# Patient Record
Sex: Female | Born: 1985
Health system: Southern US, Community
[De-identification: ages and names within clinical notes are randomized; demographics above are authoritative.]

## PROBLEM LIST (undated history)

## (undated) DIAGNOSIS — F419 Anxiety disorder, unspecified: Secondary | ICD-10-CM

## (undated) DIAGNOSIS — R519 Headache, unspecified: Secondary | ICD-10-CM

## (undated) DIAGNOSIS — K219 Gastro-esophageal reflux disease without esophagitis: Secondary | ICD-10-CM

## (undated) DIAGNOSIS — N939 Abnormal uterine and vaginal bleeding, unspecified: Secondary | ICD-10-CM

## (undated) DIAGNOSIS — R112 Nausea with vomiting, unspecified: Secondary | ICD-10-CM

## (undated) DIAGNOSIS — Z9889 Other specified postprocedural states: Secondary | ICD-10-CM

## (undated) DIAGNOSIS — N200 Calculus of kidney: Secondary | ICD-10-CM

## (undated) DIAGNOSIS — I10 Essential (primary) hypertension: Secondary | ICD-10-CM

## (undated) DIAGNOSIS — D649 Anemia, unspecified: Secondary | ICD-10-CM

## (undated) DIAGNOSIS — Z87442 Personal history of urinary calculi: Secondary | ICD-10-CM

## (undated) HISTORY — PX: CYSTOSCOPY: SUR368

## (undated) HISTORY — DX: Calculus of kidney: N20.0

## (undated) HISTORY — PX: BREAST CYST ASPIRATION: SHX578

## (undated) HISTORY — DX: Essential (primary) hypertension: I10

## (undated) SURGERY — Surgical Case
Anesthesia: *Unknown

---

## 2003-02-22 ENCOUNTER — Encounter: Admission: RE | Admit: 2003-02-22 | Discharge: 2003-02-22 | Payer: Self-pay | Admitting: Sports Medicine

## 2003-06-13 ENCOUNTER — Encounter: Admission: RE | Admit: 2003-06-13 | Discharge: 2003-06-13 | Payer: Self-pay | Admitting: Family Medicine

## 2003-06-27 ENCOUNTER — Encounter: Admission: RE | Admit: 2003-06-27 | Discharge: 2003-06-27 | Payer: Self-pay | Admitting: Family Medicine

## 2003-09-20 ENCOUNTER — Encounter: Admission: RE | Admit: 2003-09-20 | Discharge: 2003-09-20 | Payer: Self-pay | Admitting: *Deleted

## 2005-09-02 ENCOUNTER — Ambulatory Visit: Payer: Self-pay | Admitting: Family Medicine

## 2006-05-19 ENCOUNTER — Ambulatory Visit: Payer: Self-pay | Admitting: Sports Medicine

## 2006-05-25 ENCOUNTER — Ambulatory Visit: Payer: Self-pay | Admitting: Family Medicine

## 2006-06-09 ENCOUNTER — Encounter (INDEPENDENT_AMBULATORY_CARE_PROVIDER_SITE_OTHER): Payer: Self-pay | Admitting: *Deleted

## 2006-06-09 LAB — CONVERTED CEMR LAB

## 2006-06-15 LAB — CONVERTED CEMR LAB: Pap Smear: NORMAL

## 2006-06-17 ENCOUNTER — Encounter (INDEPENDENT_AMBULATORY_CARE_PROVIDER_SITE_OTHER): Payer: Self-pay | Admitting: Family Medicine

## 2006-06-17 ENCOUNTER — Ambulatory Visit: Payer: Self-pay | Admitting: Family Medicine

## 2006-06-17 LAB — CONVERTED CEMR LAB: TSH: 1.501 microintl units/mL (ref 0.350–5.50)

## 2006-08-07 ENCOUNTER — Encounter (INDEPENDENT_AMBULATORY_CARE_PROVIDER_SITE_OTHER): Payer: Self-pay | Admitting: *Deleted

## 2006-08-17 ENCOUNTER — Ambulatory Visit: Payer: Self-pay | Admitting: Family Medicine

## 2006-08-25 ENCOUNTER — Telehealth: Payer: Self-pay | Admitting: *Deleted

## 2006-08-25 ENCOUNTER — Ambulatory Visit: Payer: Self-pay | Admitting: Family Medicine

## 2006-09-15 ENCOUNTER — Ambulatory Visit: Payer: Self-pay | Admitting: Sports Medicine

## 2006-09-15 DIAGNOSIS — R61 Generalized hyperhidrosis: Secondary | ICD-10-CM | POA: Insufficient documentation

## 2006-09-15 DIAGNOSIS — N209 Urinary calculus, unspecified: Secondary | ICD-10-CM | POA: Insufficient documentation

## 2006-09-15 DIAGNOSIS — I10 Essential (primary) hypertension: Secondary | ICD-10-CM | POA: Insufficient documentation

## 2006-09-15 LAB — CONVERTED CEMR LAB
Bilirubin Urine: NEGATIVE
Blood in Urine, dipstick: NEGATIVE
Chlamydia, DNA Probe: NEGATIVE
GC Probe Amp, Genital: NEGATIVE
Glucose, Urine, Semiquant: NEGATIVE
Ketones, urine, test strip: NEGATIVE
Nitrite: NEGATIVE
Specific Gravity, Urine: >=1.03
Urobilinogen, UA: 0.2
Whiff Test: NEGATIVE
pH: 5.5

## 2006-09-16 ENCOUNTER — Encounter (INDEPENDENT_AMBULATORY_CARE_PROVIDER_SITE_OTHER): Payer: Self-pay | Admitting: Sports Medicine

## 2006-09-16 ENCOUNTER — Telehealth: Payer: Self-pay | Admitting: *Deleted

## 2006-10-19 ENCOUNTER — Telehealth (INDEPENDENT_AMBULATORY_CARE_PROVIDER_SITE_OTHER): Payer: Self-pay | Admitting: *Deleted

## 2006-10-23 ENCOUNTER — Ambulatory Visit: Payer: Self-pay | Admitting: Family Medicine

## 2006-11-13 ENCOUNTER — Ambulatory Visit: Payer: Self-pay | Admitting: Sports Medicine

## 2006-11-30 ENCOUNTER — Telehealth: Payer: Self-pay | Admitting: *Deleted

## 2006-11-30 ENCOUNTER — Encounter: Admission: RE | Admit: 2006-11-30 | Discharge: 2006-11-30 | Payer: Self-pay | Admitting: Sports Medicine

## 2006-11-30 ENCOUNTER — Ambulatory Visit: Payer: Self-pay | Admitting: Sports Medicine

## 2006-12-02 ENCOUNTER — Telehealth (INDEPENDENT_AMBULATORY_CARE_PROVIDER_SITE_OTHER): Payer: Self-pay | Admitting: *Deleted

## 2006-12-08 ENCOUNTER — Telehealth: Payer: Self-pay | Admitting: *Deleted

## 2006-12-08 ENCOUNTER — Encounter: Payer: Self-pay | Admitting: Family Medicine

## 2006-12-15 ENCOUNTER — Telehealth (INDEPENDENT_AMBULATORY_CARE_PROVIDER_SITE_OTHER): Payer: Self-pay | Admitting: *Deleted

## 2006-12-17 ENCOUNTER — Encounter: Admission: RE | Admit: 2006-12-17 | Discharge: 2006-12-17 | Payer: Self-pay | Admitting: Sports Medicine

## 2006-12-21 ENCOUNTER — Encounter: Payer: Self-pay | Admitting: Family Medicine

## 2006-12-21 ENCOUNTER — Telehealth (INDEPENDENT_AMBULATORY_CARE_PROVIDER_SITE_OTHER): Payer: Self-pay | Admitting: Family Medicine

## 2007-02-02 ENCOUNTER — Telehealth: Payer: Self-pay | Admitting: *Deleted

## 2007-02-04 ENCOUNTER — Ambulatory Visit: Payer: Self-pay | Admitting: Family Medicine

## 2007-02-04 DIAGNOSIS — E739 Lactose intolerance, unspecified: Secondary | ICD-10-CM | POA: Insufficient documentation

## 2007-04-26 ENCOUNTER — Ambulatory Visit: Payer: Self-pay | Admitting: Family Medicine

## 2007-05-03 ENCOUNTER — Encounter: Payer: Self-pay | Admitting: Family Medicine

## 2007-05-03 ENCOUNTER — Ambulatory Visit: Payer: Self-pay | Admitting: Family Medicine

## 2007-05-03 LAB — CONVERTED CEMR LAB
Bilirubin Urine: NEGATIVE
Blood in Urine, dipstick: NEGATIVE
Glucose, Urine, Semiquant: NEGATIVE
Hemoglobin: 12.8 g/dL
Ketones, urine, test strip: NEGATIVE
Nitrite: NEGATIVE
Protein, U semiquant: NEGATIVE
Specific Gravity, Urine: 1.025
Urobilinogen, UA: 0.2
pH: 6

## 2007-05-05 ENCOUNTER — Telehealth (INDEPENDENT_AMBULATORY_CARE_PROVIDER_SITE_OTHER): Payer: Self-pay | Admitting: *Deleted

## 2007-05-05 ENCOUNTER — Telehealth: Payer: Self-pay | Admitting: Family Medicine

## 2007-05-20 ENCOUNTER — Telehealth: Payer: Self-pay | Admitting: Family Medicine

## 2007-07-20 ENCOUNTER — Ambulatory Visit: Payer: Self-pay | Admitting: Family Medicine

## 2007-09-14 ENCOUNTER — Telehealth: Payer: Self-pay | Admitting: Family Medicine

## 2007-10-13 ENCOUNTER — Ambulatory Visit: Payer: Self-pay | Admitting: Family Medicine

## 2007-11-29 ENCOUNTER — Telehealth: Payer: Self-pay | Admitting: *Deleted

## 2007-12-06 ENCOUNTER — Telehealth: Payer: Self-pay | Admitting: Family Medicine

## 2007-12-22 ENCOUNTER — Ambulatory Visit: Payer: Self-pay | Admitting: Family Medicine

## 2007-12-22 DIAGNOSIS — L659 Nonscarring hair loss, unspecified: Secondary | ICD-10-CM | POA: Insufficient documentation

## 2008-01-05 ENCOUNTER — Telehealth: Payer: Self-pay | Admitting: *Deleted

## 2008-01-12 ENCOUNTER — Telehealth: Payer: Self-pay | Admitting: *Deleted

## 2008-04-24 ENCOUNTER — Telehealth: Payer: Self-pay | Admitting: *Deleted

## 2008-06-05 ENCOUNTER — Telehealth: Payer: Self-pay | Admitting: Family Medicine

## 2008-06-07 ENCOUNTER — Telehealth: Payer: Self-pay | Admitting: *Deleted

## 2008-06-23 ENCOUNTER — Telehealth (INDEPENDENT_AMBULATORY_CARE_PROVIDER_SITE_OTHER): Payer: Self-pay | Admitting: *Deleted

## 2008-06-27 ENCOUNTER — Telehealth: Payer: Self-pay | Admitting: Family Medicine

## 2008-08-17 ENCOUNTER — Ambulatory Visit: Payer: Self-pay | Admitting: Family Medicine

## 2008-08-17 ENCOUNTER — Encounter: Payer: Self-pay | Admitting: Family Medicine

## 2008-08-17 DIAGNOSIS — K649 Unspecified hemorrhoids: Secondary | ICD-10-CM | POA: Insufficient documentation

## 2008-08-17 DIAGNOSIS — N760 Acute vaginitis: Secondary | ICD-10-CM | POA: Insufficient documentation

## 2008-08-17 LAB — CONVERTED CEMR LAB
Chlamydia, Swab/Urine, PCR: NEGATIVE
GC Probe Amp, Urine: NEGATIVE
Whiff Test: NEGATIVE

## 2008-08-20 ENCOUNTER — Encounter: Payer: Self-pay | Admitting: Family Medicine

## 2009-03-16 ENCOUNTER — Ambulatory Visit: Payer: Self-pay | Admitting: Internal Medicine

## 2009-03-16 DIAGNOSIS — K921 Melena: Secondary | ICD-10-CM | POA: Insufficient documentation

## 2009-03-19 ENCOUNTER — Ambulatory Visit: Payer: Self-pay | Admitting: Gastroenterology

## 2009-03-19 DIAGNOSIS — K589 Irritable bowel syndrome without diarrhea: Secondary | ICD-10-CM | POA: Insufficient documentation

## 2009-03-19 LAB — CONVERTED CEMR LAB
ALT: 20 units/L (ref 0–35)
ALT: 17 units/L (ref 0–35)
AST: 22 units/L (ref 0–37)
AST: 22 units/L (ref 0–37)
Albumin: 4.3 g/dL (ref 3.5–5.2)
Albumin: 4.3 g/dL (ref 3.5–5.2)
Alkaline Phosphatase: 54 units/L (ref 39–117)
Alkaline Phosphatase: 57 units/L (ref 39–117)
BUN: 9 mg/dL (ref 6–23)
BUN: 9 mg/dL (ref 6–23)
Basophils Absolute: 0 10*3/uL (ref 0.0–0.1)
Basophils Absolute: 0.1 10*3/uL (ref 0.0–0.1)
Basophils Relative: 0.3 % (ref 0.0–3.0)
Basophils Relative: 1.2 % (ref 0.0–3.0)
Bilirubin, Direct: 0.1 mg/dL (ref 0.0–0.3)
Bilirubin, Direct: 0.1 mg/dL (ref 0.0–0.3)
CO2: 20 meq/L (ref 19–32)
CO2: 26 meq/L (ref 19–32)
Calcium: 10 mg/dL (ref 8.4–10.5)
Calcium: 9.4 mg/dL (ref 8.4–10.5)
Chloride: 105 meq/L (ref 96–112)
Chloride: 105 meq/L (ref 96–112)
Creatinine, Ser: 0.8 mg/dL (ref 0.4–1.2)
Creatinine, Ser: 0.7 mg/dL (ref 0.4–1.2)
Eosinophils Absolute: 0.1 10*3/uL (ref 0.0–0.7)
Eosinophils Absolute: 0.1 10*3/uL (ref 0.0–0.7)
Eosinophils Relative: 2 % (ref 0.0–5.0)
Eosinophils Relative: 1.3 % (ref 0.0–5.0)
Ferritin: 79 ng/mL (ref 10.0–291.0)
Folate: >20 ng/mL
GFR calc non Af Amer: 111.84 mL/min (ref >60)
GFR calc non Af Amer: 130.46 mL/min (ref >60)
Glucose, Bld: 64 mg/dL — ABNORMAL LOW (ref 70–99)
Glucose, Bld: 76 mg/dL (ref 70–99)
HCT: 39.5 % (ref 36.0–46.0)
HCT: 36.3 % (ref 36.0–46.0)
Hemoglobin: 13.7 g/dL (ref 12.0–15.0)
Hemoglobin: 13 g/dL (ref 12.0–15.0)
IgA: 218 mg/dL (ref 68–378)
Iron: 44 ug/dL (ref 42–145)
Lymphocytes Relative: 35.2 % (ref 12.0–46.0)
Lymphocytes Relative: 37.3 % (ref 12.0–46.0)
Lymphs Abs: 2.5 10*3/uL (ref 0.7–4.0)
Lymphs Abs: 2.8 10*3/uL (ref 0.7–4.0)
MCHC: 34.6 g/dL (ref 30.0–36.0)
MCHC: 35.7 g/dL (ref 30.0–36.0)
MCV: 95.3 fL (ref 78.0–100.0)
MCV: 97.2 fL (ref 78.0–100.0)
Monocytes Absolute: 0.6 10*3/uL (ref 0.1–1.0)
Monocytes Absolute: 0.5 10*3/uL (ref 0.1–1.0)
Monocytes Relative: 7.9 % (ref 3.0–12.0)
Monocytes Relative: 6.4 % (ref 3.0–12.0)
Neutro Abs: 3.8 10*3/uL (ref 1.4–7.7)
Neutro Abs: 4 10*3/uL (ref 1.4–7.7)
Neutrophils Relative %: 54.6 % (ref 43.0–77.0)
Neutrophils Relative %: 53.8 % (ref 43.0–77.0)
Platelets: 306 10*3/uL (ref 150.0–400.0)
Platelets: 277 10*3/uL (ref 150.0–400.0)
Potassium: 4.6 meq/L (ref 3.5–5.1)
Potassium: 4.6 meq/L (ref 3.5–5.1)
RBC: 4.14 M/uL (ref 3.87–5.11)
RBC: 3.74 M/uL — ABNORMAL LOW (ref 3.87–5.11)
RDW: 12.3 % (ref 11.5–14.6)
RDW: 12.1 % (ref 11.5–14.6)
Saturation Ratios: 14.4 % — ABNORMAL LOW (ref 20.0–50.0)
Sed Rate: 17 mm/hr (ref 0–22)
Sodium: 136 meq/L (ref 135–145)
Sodium: 140 meq/L (ref 135–145)
TSH: 1.58 microintl units/mL (ref 0.35–5.50)
TSH: 1.28 microintl units/mL (ref 0.35–5.50)
Tissue Transglutaminase Ab, IgA: 0.4 units (ref <7)
Total Bilirubin: 1 mg/dL (ref 0.3–1.2)
Total Bilirubin: 0.6 mg/dL (ref 0.3–1.2)
Total Protein: 7 g/dL (ref 6.0–8.3)
Total Protein: 7.6 g/dL (ref 6.0–8.3)
Transferrin: 217.7 mg/dL (ref 212.0–360.0)
Vitamin B-12: 609 pg/mL (ref 211–911)
WBC: 7 10*3/uL (ref 4.5–10.5)
WBC: 7.5 10*3/uL (ref 4.5–10.5)

## 2009-03-26 ENCOUNTER — Telehealth: Payer: Self-pay | Admitting: Gastroenterology

## 2009-03-30 ENCOUNTER — Ambulatory Visit: Payer: Self-pay | Admitting: Gastroenterology

## 2009-05-01 ENCOUNTER — Encounter: Admission: RE | Admit: 2009-05-01 | Discharge: 2009-06-06 | Payer: Self-pay | Admitting: Gastroenterology

## 2009-05-01 ENCOUNTER — Encounter: Payer: Self-pay | Admitting: Gastroenterology

## 2009-05-11 ENCOUNTER — Ambulatory Visit: Payer: Self-pay | Admitting: Gastroenterology

## 2009-05-11 DIAGNOSIS — R059 Cough, unspecified: Secondary | ICD-10-CM | POA: Insufficient documentation

## 2009-05-11 DIAGNOSIS — R05 Cough: Secondary | ICD-10-CM

## 2009-07-02 ENCOUNTER — Telehealth: Payer: Self-pay | Admitting: Gastroenterology

## 2009-07-13 ENCOUNTER — Encounter: Admission: RE | Admit: 2009-07-13 | Discharge: 2009-07-13 | Payer: Self-pay | Admitting: Emergency Medicine

## 2009-07-17 ENCOUNTER — Telehealth (INDEPENDENT_AMBULATORY_CARE_PROVIDER_SITE_OTHER): Payer: Self-pay | Admitting: *Deleted

## 2009-07-21 ENCOUNTER — Emergency Department (HOSPITAL_COMMUNITY): Admission: EM | Admit: 2009-07-21 | Discharge: 2009-07-21 | Payer: Self-pay | Admitting: Emergency Medicine

## 2009-07-27 ENCOUNTER — Telehealth (INDEPENDENT_AMBULATORY_CARE_PROVIDER_SITE_OTHER): Payer: Self-pay | Admitting: *Deleted

## 2009-09-27 ENCOUNTER — Telehealth: Payer: Self-pay | Admitting: Family Medicine

## 2009-10-08 ENCOUNTER — Telehealth: Payer: Self-pay | Admitting: Gastroenterology

## 2010-07-09 NOTE — Progress Notes (Signed)
Summary: refll  Phone Note Call from Patient Call back at Home Phone [phone]   Caller: Patient Summary of Call: needs this refilled today (see rx note) Initial call taken by: De Nurse,  September 27, 2009 10:00 AM    done

## 2010-07-09 NOTE — Assessment & Plan Note (Signed)
Summary: F/U APPT...LSW.   History of Present Illness Visit Type: Follow-up Visit Primary GI MD: Sheryn Bison MD FACP FAGA Primary Bita Cartwright: Linus Mako, MD Requesting Amonda Brillhart: n/a Chief Complaint: Loose stools and bleeding has stopped, bowel habits normal History of Present Illness:   This patient is a 25 year old female who has lactose intolerance her previous clinic visit. Review of all lab data is unremarkable. She currently is asymptomatic in terms of diarrhea, abdominal pain or rectal bleeding. She does have recurrent coughing and a globus sensation in throat but denies true reflux symptoms. She takes acyclovir 4 mg twice a day and metaprolol 50 mg twice a day for hypertension. She has a family history of early coronary artery disease and apparently suffers from hyperlipidemia.   GI Review of Systems      Denies abdominal pain, acid reflux, belching, bloating, chest pain, dysphagia with liquids, dysphagia with solids, heartburn, loss of appetite, nausea, vomiting, vomiting blood, weight loss, and  weight gain.        Denies anal fissure, black tarry stools, change in bowel habit, constipation, diarrhea, diverticulosis, fecal incontinence, heme positive stool, hemorrhoids, irritable bowel syndrome, jaundice, light color stool, liver problems, rectal bleeding, and  rectal pain.    Current Medications (verified): 1)  Acyclovir 400 Mg Tabs (Acyclovir) .... Two Times A Day 2)  Metoprolol Tartrate 50 Mg Tabs (Metoprolol Tartrate) .Marland Kitchen.. 1 By Mouth Two Times A Day  Allergies (verified): 1)  ! * Elidil 2)  Flagyl (Metronidazole)  Past History:  Past medical, surgical, family and social histories (including risk factors) reviewed for relevance to current acute and chronic problems.  Past Medical History: Reviewed history from 03/16/2009 and no changes required. depo for bc, frequent vaginitis, kidney stone with litho and stent, urinary frequency HTN  Past Surgical  History: Reviewed history from 03/16/2009 and no changes required. s/p stone retreival procedure x 2  - 1999 - in [place]  Family History: Reviewed history from 03/19/2009 and no changes required. cad--mom 30s mi and CABG in her 40's, dm--uncle, htn--mom and dad No FH of Colon Cancer:  Social History: Reviewed history from 03/16/2009 and no changes required. No smoking Denies sexual activity Occupation: - student - [employer] Alcohol use-yes Single no children  Review of Systems       The patient complains of cough, shortness of breath, and thirst - excessive.  The patient denies allergy/sinus, anemia, anxiety-new, arthritis/joint pain, back pain, blood in urine, breast changes/lumps, change in vision, confusion, coughing up blood, depression-new, fainting, fatigue, fever, headaches-new, hearing problems, heart murmur, heart rhythm changes, itching, menstrual pain, muscle pains/cramps, night sweats, nosebleeds, pregnancy symptoms, skin rash, sleeping problems, sore throat, swelling of feet/legs, swollen lymph glands, thirst - excessive , urination - excessive , urination changes/pain, urine leakage, vision changes, and voice change.    Vital Signs:  Patient profile:   25 year old female Height:      63 inches Weight:      117.25 pounds Pulse rate:   64 / minute Pulse rhythm:   regular BP sitting:   96 / 62  (right arm) Cuff size:   regular  Physical Exam  General:  Well developed, well nourished, no acute distress.healthy appearing.   Head:  Normocephalic and atraumatic. Eyes:  PERRLA, no icterus.exam deferred to patient's ophthalmologist.   Lungs:  Clear throughout to auscultation. Heart:  Regular rate and rhythm; no murmurs, rubs,  or bruits. Abdomen:  Soft, nontender and nondistended. No masses, hepatosplenomegaly or hernias noted. Normal  bowel sounds. Cervical Nodes:  No significant cervical adenopathy. Psych:  Alert and cooperative. Normal mood and affect.   Impression &  Recommendations:  Problem # 1:  IRRITABLE BOWEL SYNDROME (ICD-564.1) Assessment Improved She has IBS and lactose intolerance is currently doing well with avoidance of major lactose products and other nonabsorbable carbohydrates.  Problem # 2:  COUGH (ICD-786.2) Assessment: New This may be from gastroesophageal reflux disease and I will give her a trial of AcipHex 20 mg 30 minutes before breakfast with followup in one month's time.  Problem # 3:  HYPERTENSION, BENIGN (ICD-401.1) Assessment: Improved blood pressure today is 96/62. Apparently her primary care physician is adjusting her metaprolol dosage.  Patient Instructions: 1)  Copy sent to : Dr. Linus Mako 2)  Please continue current medications.  3)  Avoid foods high in acid content ( tomatoes, citrus juices, spicy foods) . Avoid eating within 3 to 4 hours of lying down or before exercising. Do not over eat; try smaller more frequent meals. Elevate head of bed four inches when sleeping.  4)  AcipHex trial 20 mg q.a.m. 5)  Please schedule a follow-up appointment in 1 month.  6)  Lactose Free Diet handout given.   Appended Document: F/U APPT...LSW.    Clinical Lists Changes  Medications: Added new medication of ACIPHEX 20 MG  TBEC (RABEPRAZOLE SODIUM) Take 1 each day 30 minutes before meals

## 2010-07-09 NOTE — Letter (Signed)
Summary: Care Plan/[facility]  Care Plan/NUTRI[employer]DBS[facility]MT   Imported By: Lester Towanda 06/08/2009 09:41:08  _____________________________________________________________________  External Attachment:    Type:   Image     Comment:   External Document

## 2010-07-09 NOTE — Assessment & Plan Note (Signed)
Summary: FU FROM [place] WP   Vital Signs:  Patient Profile:   25 Years Old Female Weight:      124.0 pounds Temp:     97.1 degrees F Pulse rate:   73 / minute BP sitting:   116 / 78  (left arm)  Pt. in pain?   no  Vitals Entered By: Starleen Blue RN (May 03, 2007 1:41 PM)                  Chief Complaint:  sweating, hemorrhoid, and bladder pressure.  Acute Visit History:      Other comments include: pt is a 25 y/o female here with multiple complaints. she complains of excessive sweating, particularly in the inguinal region, which she has been evaluated for in the past. she also complains of a hemorrhoid, which has had increased bleeding over the last two months. the hemorrhoid has been there several years and she has always noted a little bleeding esp when constipated. the hemorrhoid is only painful when constipated and pt straining to pass stool. pt also concerned about episode of unprotected sex two years ago and constellation of benign dxs since (PNA, tinea versicolor, viral URI) and would like an HIV test. also diagnosed with UTI last year, but reports continued bladder pressure with urination. states she completed full course of abx at that time. denies dysuria, foul odor,  change in urine color, etc. .        Current Allergies: FLAGYL (METRONIDAZOLE)       Physical Exam  General:     Well-developed,well-nourished,in no acute distress; alert,appropriate and cooperative throughout examination Abdomen:     Bowel sounds positive,abdomen soft and non-tender without masses, organomegaly or hernias noted. no CVA tenderness bilaterally Rectal:     external hemorrhoid. no bleeding noted. FOBT negative. no masses, no tenderness, no fissures, no fistulae, no perianal rash Cervical Nodes:     No lymphadenopathy noted Psych:     Oriented X3, normally interactive, good eye contact, and slightly anxious.      Impression & Recommendations:  Problem # 1:  EXTERNAL HEMORRHOIDS  WITH OTHER COMPLICATION (ICD-455.5) Assessment: Unchanged No obvious site of bleeding. will treat with stool softener for six weeks and anusol for pain. FOBT negative. pt may need eval in future for internal hemorrhoids. Encouraged to eat high fiber diet.   Orders: Hemoglobin-[facility] (16109) [facility]- Est Level  3 (60454)   Problem # 2:  HYPERHIDROSIS (ICD-780.8) Assessment: Unchanged This problem is not new for patient. She is encouraged to use antiperspirant for symptom control. If not helping, rx given for Drysol. May need continued mgmt by PCP.   Problem # 3:  SEXUALLY TRANSMITTED DISEASE, EXPOSURE TO (ICD-V01.6) Assessment: Comment Only Pt counseled on safe sexual practices and using condoms. Only concerned about HIV. Will check today.   Orders: HIV-[facility] (09811-91478) [facility]- Est Level  3 (29562)   Problem # 4:  SYMPTOM, FREQUENCY, URINARY (ICD-788.41) Assessment: Comment Only U/A with lots of epithelial cells but some bacteria and some leukocyte esterace. will hold on abx treatment and await results of urine culture.  Orders: Urinalysis-[facility] (00000) Urine Culture-[facility] ([id number]) [facility]- Est Level  3 (96295)   Complete Medication List: 1)  Acyclovir 400 Mg Tabs (Acyclovir) .... Take 1 tablet twice a day 2)  Metoprolol Tartrate 50 Mg Tabs (Metoprolol tartrate) 3)  Colace 100 Mg Caps (Docusate sodium) .... One tab by mouth two times a day x6 weeks 4)  Anusol-hc 2.5 % Crea (Hydrocortisone) .Marland KitchenMarland KitchenMarland Kitchen  Apply to anal area twice a day 5)  Drysol 20 % Soln (Aluminum chloride) .... Apply to affected area at bedtime   Patient Instructions: 1)  Follow up in 4-6 weeks with Dr. Jennette Kettle.  2)  Apply antiperspirant to problem areas as needed.     Prescriptions: DRYSOL 20 %  SOLN (ALUMINUM CHLORIDE) apply to affected area at bedtime  #1 x 0   Entered and Authorized by:   Lequita Asal  MD   Signed by:   Lequita Asal  MD on 05/03/2007   Method used:   Electronically sent to ...       [facility] [address]       [address]       [place], [place]  [address]       Ph: [phone]       Fax: [phone]   RxID:   [id number] ANUSOL-HC 2.5 %  CREA (HYDROCORTISONE) apply to anal area twice a day  #1 x 0   Entered and Authorized by:   Lequita Asal  MD   Signed by:   Lequita Asal  MD on 05/03/2007   Method used:   Electronically sent to ...       [facility] [address]       [address]       [place], [place]  [address]       Ph: [phone]       Fax: [phone]   RxID:   [id number] COLACE 100 MG CAPS (DOCUSATE SODIUM) one tab by mouth two times a day x6 weeks  #90 x 0   Entered and Authorized by:   Lequita Asal  MD   Signed by:   Lequita Asal  MD on 05/03/2007   Method used:   Electronically sent to ...       [facility] [address]       [address]       [place], [place]  [address]       Ph: [phone]       Fax: [phone]   RxID:   [id number]  ] Laboratory Results   Urine Tests  Date/Time Received: May 03, 2007 2.26  PM  Date/Time Reported: May 03, 2007 3:04 PM   Routine Urinalysis   Color: yellow Appearance: Clear Glucose: negative   (Normal Range: Negative) Bilirubin: negative   (Normal Range: Negative) Ketone: negative   (Normal Range: Negative) Spec. Gravity: 1.025   (Normal Range: 1.003-1.035) Blood: negative   (Normal Range: Negative) pH: 6.0   (Normal Range: 5.0-8.0) Protein: negative   (Normal Range: Negative) Urobilinogen: 0.2   (Normal Range: 0-1) Nitrite: negative   (Normal Range: Negative) Leukocyte Esterace: small   (Normal Range: Negative)  Urine Microscopic WBC/hpf: 5-10 Bacteria: 2+ Mucous: 2+ Epithelial: 10-20    Comments: ...............test performed by......Marland KitchenBonnie A. [place], [place] (ASCP)   Blood Tests   Date/Time Received: May 03, 2007 2:29 PM  Date/Time Reported:  May 03, 2007 2:51 PM    CBC HGB:  12.8 g/dL   (Normal Range: 41.7-40.8 in Males, 12.0-15.0 in Females) Comments: ...................................................................DONNA [facility]  May 03, 2007 2:52 PM

## 2010-07-09 NOTE — Progress Notes (Signed)
Summary: [place] request  Phone Note Call from Patient Call back at Work Phone [phone]   Summary of Call: Pt is requesting to speak with an rn about an allergic reaction to hypercare. Initial call taken by: Haydee Salter,  May 05, 2007 10:33 AM  Follow-up for Phone Call        Pt states she used hypercare for her sweating - advised this is the same thing as drysol as they are both aluminum chloride.  Pt states this caused her to break out in a rash in the areas that she used it in.  Advised pt not to use again, if causing skin irritation.  Advised she can schedule appt with Dr. Jennette Kettle to discuss - pt states she will try using a OTC deodorant in the affected areas, and call back if this does not help.  Follow-up by: AMY MARTIN RN,  May 05, 2007 10:45 AM

## 2010-07-09 NOTE — Assessment & Plan Note (Signed)
Summary: fever cough for 1 week/ls   Vital Signs:  Patient Profile:   25 Years Old Female Weight:      125.8 pounds (57.18 kg) O2 Sat:      99 % Temp:     98.6 degrees F (37.00 degrees C) Pulse rate:   89 / minute BP sitting:   129 / 85  (left arm)  Vitals Entered By: Tomasa Rand (November 30, 2006 9:40 AM)               PCP:  Albertha Ghee MD  Chief Complaint:  Pt c/o fever and cough for one week.  History of Present Illness: Pt reports frequent fever and cough x 1 week. Temperature last pm 102.8 relieved by Tylenol. Cough nonproductive. Pt denies any chest pain, sob, n/v, anorexia. No one else in home sick.       Review of Systems  General      Complains of chills and fever.      Denies loss of appetite.  ENT      Complains of hoarseness.      Denies earache, nasal congestion, postnasal drainage, sinus pressure, and sore throat.  Resp      Complains of cough.      Denies chest discomfort, shortness of breath, sputum productive, and wheezing.  GI      Denies abdominal pain, nausea, and vomiting.   Physical Exam  General:     Well-developed,well-nourished,in no acute distress; alert,appropriate and cooperative throughout examination Ears:     TM's clear bilaterally, no retractions, bulging or exudate Nose:     No nasal drainage; nasal turbinates nonedematous Mouth:     Good dentition. Tongue piercing noted. Mucous membranes moist. No lesions noted. No swelling or exudate noted to posterior pharynx, slight erythema. Neck:     supple, full ROM, and no cervical lymphadenopathy.   Lungs:     No increased wob. Crackles bilaterally in RLL and LLL. Heart:     normal rate, regular rhythm, no murmur, no gallop, and no rub.   Neurologic:     alert & oriented X3.   Skin:     turgor normal and color normal.   Cervical Nodes:     no anterior cervical adenopathy and no posterior cervical adenopathy.   Psych:     Oriented X3 and good eye contact.       Impression & Recommendations:  Problem # 1:  FUO (ICD-780.6) Assessment: New Due to duration of symptoms and high fever, 2view chest ordered to r/o pneumonia. Differential includes acute bronchitis. Pt treated with Zithromax 500mg  x 1 then 250mg  x 4 days. Will follow-up with chest xray Add: CXR with left wedge shaped density in the medial lung base.  Patient deneis SOB, sitting for long periods, chest pain.  She is on Depo Provera and has been on it for one year.  Doubt this is a pulmonary emboli.  I call the patient in follow up 24 hours after being seen, and she did not have fever last night and was feeling better.  She is to get a repeat CXR in 2 weeks, order up front at the desk.  Problem # 2:  COUGH (ICD-786.2) Assessment: New  Orders: Pulse Oximetry- [facility] (94760) Tussionex susp. 1 tsp by mouth q12h as needed cough  Other Orders: Radiology other (Radiology Other)   Patient Instructions: 1)  Take 650-1000mg  of Tylenol every 4-6 hours as needed for relief of pain or comfort of fever AVOID taking  more than 4000mg   in a 24 hour period (can cause liver damage in higher doses). 2)  Go to [facility] to get a chest xray. We will call with the results. Go ahead and start the antibiotic as instructed.

## 2010-07-09 NOTE — Assessment & Plan Note (Signed)
Summary: respirtory s/s & thinks she is lactos intolerant  Medications Added ACYCLOVIR 400 MG TABS (ACYCLOVIR) Take 1 tablet twice a day METOPROLOL TARTRATE 50 MG TABS (METOPROLOL TARTRATE)       Allergies Added: FLAGYL (METRONIDAZOLE)  Vital Signs:  Patient Profile:   25 Years Old Female Weight:      128 pounds Pulse rate:   57 / minute BP sitting:   134 / 87  Vitals Entered By: Lillia Pauls CMA (February 04, 2007 11:18 AM)                 PCP:  Albertha Ghee MD  Chief Complaint:  respiratory sxs and poss lactose intolerance.  History of Present Illness: 25 yo AAF  with hx of PNA on 06/08  c/o  that  6  days ago she  was 99.7 temp, with  body aches , ST, and  dry cough . Pt also reports  some runny nose. Elizabeth Ponce has been using Tylenol as needed and helped.  No SOB, n, v, d, abd. pain, ear. . NO sick contacts. Elizabeth Ponce was concerned because she had PNA on 06/08 and wanted to be sure that did not have it know. A C-xray 2 wks after Zithromax tx show pna resolution.  2- Lactose intolerance?: pt wants to know if she is lactose intolerant. She reports diarrhea when drinking milk , eating cheese.  3- Contraception:  per pt , she is due for depo- provera IM  Current Allergies: FLAGYL (METRONIDAZOLE)      Physical Exam  General:     Well-developed,well-nourished,in no acute distress; alert,appropriate and cooperative throughout examination Eyes:     vision grossly intact and no injection.   Ears:     External ear exam shows no significant lesions or deformities.  Otoscopic examination reveals clear canals, tympanic membranes are intact bilaterally without bulging, retraction, inflammation or discharge. Hearing is grossly normal bilaterally. Nose:     mucosal erythema.   Mouth:     good dentition, no gingival abnormalities, and pharyngeal erythema no exudates.  Lungs:     Normal respiratory effort, chest expands symmetrically. Lungs are clear to auscultation, no  crackles or wheezes. Heart:     Normal rate and regular rhythm. S1 and S2 normal without gallop, murmur, click, rub or other extra sounds. Abdomen:     Bowel sounds positive,abdomen soft and non-tender without masses, organomegaly or hernias noted. Pulses:     2 +  peripheral pulses Extremities:     no edema -    Impression & Recommendations:  Problem # 1:  UPPER RESPIRATORY INFECTION, VIRAL (ICD-465.9) Clear lungs on exam . Clinically stable. Symptoms most likely 2 to URI ( viral). Supportive care : increase fluid intake, tylenol as needed. If SOB, productive cough, condition worsen return to [facility]/ ER Orders: [facility]- Est Level  3 (04540)   Problem # 2:  LACTOSE INTOLERANCE (ICD-271.3) Switch to lactaid ( lactose free milk) or soy milk . F/u with pcp Orders: [facility]- Est Level  3 (98119)   Complete Medication List: 1)  Acyclovir 400 Mg Tabs (Acyclovir) .... Take 1 tablet twice a day 2)  Metoprolol Tartrate 50 Mg Tabs (Metoprolol tartrate)  Other Orders: Depo-Provera 150mg  (J4782)   Patient Instructions: 1)  NEXT DEPO SHOT IS DUE: NOVEMBER 13-27, 2008. PLEASE CALL IN NOVEMBER TO SCHEDULE THIS APPT.      Medication Administration  Injection # 1:    Medication: Depo-Provera 150mg     Diagnosis: CONTRACEPTIVE MANAGEMENT (ICD-V25.09)  Route: IM    Site: L deltoid    Exp Date: 04/09/2009    Lot #: [id number]    Mfr: [email or web address]    Patient tolerated injection without complications    Given by: Lillia Pauls CMA (February 04, 2007 12:13 PM)  Orders Added: 1)  Depo-Provera 150mg  [J1055] 2)  [facility]- Est Level  3 [99213]

## 2010-07-09 NOTE — Progress Notes (Signed)
Summary: Imaging Results   Phone Note Outgoing Call   Call placed by: Army Fossa CMA,  July 17, 2009 10:25 AM Summary of Call: Imaging Results, LMTCB:  US Renal, US Pelvis: Normal  Follow-up for Phone Call        Pt is aware. Army Fossa CMA  July 18, 2009 2:37 PM

## 2010-07-09 NOTE — Assessment & Plan Note (Signed)
Summary: depo bassett wk  Nurse Visit       Medication Administration  Injection # 1:    Medication: Depo-Provera 150mg     Diagnosis: CONTRACEPTIVE MANAGEMENT (ICD-V25.09)    Route: IM    Site: R deltoid    Exp Date: 05/09/2009    Lot #: [id number]    Mfr: [email or web address]    Comments: next depo due aug 22-sept 5, 2008    Patient tolerated injection without complications    Given by: Dedra Skeens CMA, (November 13, 2006 12:19 PM)  Orders Added: 1)  Depo-Provera 150mg  [J1055]

## 2010-07-09 NOTE — Progress Notes (Signed)
Summary: wi request  Phone Note Call from Patient   Reason for Call: Talk to Nurse Summary of Call: pt is requesting wi appt, she thinks she may be having allergic reaction to laundry detergent Initial call taken by: ERIN LEVAN,  Oct 19, 2006 4:09 PM  Follow-up for Phone Call        Phone call to pt- left message to return my call.  Follow-up by: AMY MARTIN RN,  Oct 19, 2006 4:53 PM  Additional Follow-up for Phone Call Additional follow up Details #1::        Phone call to pt again at home, left message to call back.  Called her work number, no answer.   Additional Follow-up by: AMY MARTIN RN,  Oct 19, 2006 5:07 PM    Appended Document: wi request Pt returned call- states she has rash on stomach, back - states she thinks it may be from her laundry detergent.  She has tried hydrocortisone cream with no relief.  Advised pt to try benedryl - but this will make her drowsy - do not drive or take at work.  Also suggested switching laundry detergents- she states she will.  Scheduled pt for Friday, asked her to call her back if rash is better by then, or if it worsens.

## 2010-07-09 NOTE — Progress Notes (Signed)
Summary: Discuss RX  Phone Note Call from Patient Call back at [phone]   Summary of Call: pt wants to discuss zynthromysen Initial call taken by: Haydee Salter,  December 02, 2006 11:37 AM  Follow-up for Phone Call        Pt states she is not having a problem with the azithromycin now.  States last evening when she was going to bed she felt like her chest was tight, not to the point that she could not breath, but she just felt tense, states her heart was racing, and every time she would drift off to sleep she startled herself awake - states she just feels weird.  Worked her in to see Dr. Jennette Kettle 12/04/06 at 8:30am.  Follow-up by: AMY MARTIN RN,  December 02, 2006 12:31 PM

## 2010-07-09 NOTE — Progress Notes (Signed)
Summary: Triage  Phone Note Call from Patient Call back at Work Phone [phone]   Summary of Call: c/o cold symptoms, wants to discuss what she can get from pharmacy Initial call taken by: Haydee Salter,  April 24, 2008 10:44 AM  Follow-up for Phone Call        has had a cold x 1 week. feels better now, but wants to know what otc she can take since she has HTN. told her coricidin, mucinex, afrin nasal x 3 days only. pt agreed with answer Follow-up by: Golden Circle RN,  April 24, 2008 11:02 AM

## 2010-07-09 NOTE — Progress Notes (Signed)
Summary: Medication question  Phone Note Call from Patient Call back at [phone]   Summary of Call: pt has some questions about one of her medications Initial call taken by: Haydee Salter,  September 16, 2006 10:40 AM  Follow-up for Phone Call        PT PICKED UP CIPROFLOXIN YESTERDAY AND READ ON INFORMATION NOT TO TAKE WITH  ANYTHING WITH CALCIUM, IRON, MAGNESIUM AND SHE IS WONDERING ABOUT WHAT SHE CAN EAT BECAUSE FOODS HAVE THESE INGREDIENTS.  REASSURED THAT SHE CAN EAT HER REG DIET WITHOUT WORRY Follow-up by: Theresia Lo RN,  September 16, 2006 11:20 AM

## 2010-07-09 NOTE — Progress Notes (Signed)
Summary: Triage  Phone Note Call from Patient Call back at Work Phone [phone]   Reason for Call: Talk to Nurse Summary of Call: Pt is wanting to speak with a nurse about how long it takes for depo to get out of your system. Initial call taken by: Haydee Salter,  December 06, 2007 3:03 PM  Follow-up for Phone Call        wants alternative method of contraception. has appt with pcp 12/22/07 . has read about detox from an internet site and wanted to know how long it will take to accomplish this. told her I had never heard about detox, but she can ask her doctor at her appt.  most women are able to conceive any where from 1-9 months after stopping depo. told her it was very individual. she was ok with answer Follow-up by: Golden Circle RN,  December 06, 2007 3:07 PM

## 2010-07-09 NOTE — Assessment & Plan Note (Signed)
Summary: HEMATOCHEZIA/YF   History of Present Illness Visit Type: Initial Consult Primary GI MD: Sheryn Bison MD FACP FAGA Primary Provider: Linus Mako, MD Requesting Provider: Oliver Barre, MD Chief Complaint: c/o gas after eating, bloating constipation and rectal bleeding from hemorrhoids in the past History of Present Illness:   This patient is a 25 year old African American female has a long history of recurrent vaginitis she currently is on clindamycin and having some loose stooling without rectal bleeding or abdominal pain. She is self-referred today to the office for evaluation of abdominal gas, bloating, and excessive flatus.  Crystals main concern today is that she has bleeding, excessive flatus, and smells bad. She has known lactose intolerance but continues to use lactose products and also chews a lot amount of sorbitol with chewing gum. She has chronic mild constipation but to me he denies rectal bleeding or melena. Some of her increased diet as related to increased fiber in her diet per Dr.Lowne. She also has a history of recurrent kidney stones and also has chronically cold hands and feet but no other evidence of cardiovascular or peripheral vascular disease. She denies Raynaud's phenomenon or chronic reflux problems. She alleges had 10 pound weight loss over the last year. She is on Toprol 50 mg twice a day for hypertension and takes acyclovir 400 mg twice a day. Her family history noncontributory. Verdon Cummins was she's had previous barium studies or endoscopic exams.   GI Review of Systems    Reports bloating and  weight loss.   Weight loss of 10 pounds   Denies abdominal pain, acid reflux, belching, chest pain, dysphagia with liquids, dysphagia with solids, heartburn, loss of appetite, nausea, vomiting, vomiting blood, and  weight gain.      Reports constipation.     Denies anal fissure, black tarry stools, change in bowel habit, diarrhea, diverticulosis, fecal incontinence, heme  positive stool, hemorrhoids, irritable bowel syndrome, jaundice, light color stool, liver problems, rectal bleeding, and  rectal pain.    Current Medications (verified): 1)  Acyclovir 400 Mg Tabs (Acyclovir) .... Two Times A Day 2)  Metoprolol Tartrate 50 Mg Tabs (Metoprolol Tartrate) .Marland Kitchen.. 1 By Mouth Two Times A Day  Allergies (verified): 1)  ! * Elidil 2)  Flagyl (Metronidazole)  Past History:  Past medical, surgical, family and social histories (including risk factors) reviewed for relevance to current acute and chronic problems.  Past Medical History: Reviewed history from 03/16/2009 and no changes required. depo for bc, frequent vaginitis, kidney stone with litho and stent, urinary frequency HTN  Past Surgical History: Reviewed history from 03/16/2009 and no changes required. s/p stone retreival procedure x 2  - 1999 - in [place]  Family History: Reviewed history from 03/16/2009 and no changes required. cad--mom 30s mi and CABG in her 40's, dm--uncle, htn--mom and dad No FH of Colon Cancer:  Social History: Reviewed history from 03/16/2009 and no changes required. No smoking Denies sexual activity Occupation: - student - [employer] Alcohol use-yes Single no children  Review of Systems       The patient complains of allergy/sinus and headaches-new.  The patient denies anemia, anxiety-new, arthritis/joint pain, back pain, blood in urine, breast changes/lumps, change in vision, confusion, cough, coughing up blood, depression-new, fainting, fatigue, fever, hearing problems, heart murmur, heart rhythm changes, itching, menstrual pain, muscle pains/cramps, night sweats, nosebleeds, pregnancy symptoms, shortness of breath, skin rash, sleeping problems, sore throat, swelling of feet/legs, swollen lymph glands, thirst - excessive , urination - excessive , urination changes/pain, urine  leakage, vision changes, and voice change.    Vital Signs:  Patient profile:   25 year old  female Height:      63 inches Weight:      116.25 pounds BMI:     20.67 Pulse rate:   72 / minute Pulse rhythm:   regular BP sitting:   118 / 74  (left arm)  Vitals Entered By: Milford Cage NCMA (March 19, 2009 3:00 PM)  Physical Exam  General:  Well developed, well nourished, no acute distress.healthy appearing.   Head:  Normocephalic and atraumatic. Eyes:  PERRLA, no icterus.exam deferred to patient's ophthalmologist.   Neck:  Supple; no masses or thyromegaly. Lungs:  Clear throughout to auscultation. Heart:  Regular rate and rhythm; no murmurs, rubs,  or bruits. Abdomen:  Soft, nontender and nondistended. No masses, hepatosplenomegaly or hernias noted. Normal bowel sounds. Rectal:  deferred Msk:  Symmetrical with no gross deformities. Normal posture. Pulses:  Normal pulses noted. Extremities:  No clubbing, cyanosis, edema or deformities noted. Neurologic:  Alert and  oriented x4;  grossly normal neurologically. Cervical Nodes:  No significant cervical adenopathy. Psych:  Alert and cooperative. Normal mood and affect.   Impression & Recommendations:  Problem # 1:  IRRITABLE BOWEL SYNDROME (ICD-564.1) Assessment Unchanged this patient has chronic IBS and also has chronic malabsorption of non-digestible carbohydrates with associated lactose intolerance. I placed her on lactose-free diet with use of p.r.n. Lactaid tablets and have eliminated sorbitol and fructose from her diet. We will check basic laboratory parameters and celiac antibodies. I see no need for colonoscopy exams or barium studies at this point. Orders: TLB-CBC Platelet - w/Differential (85025-CBCD) TLB-BMP (Basic Metabolic Panel-BMET) (80048-METABOL) TLB-Hepatic/Liver Function Pnl (80076-HEPATIC) TLB-TSH (Thyroid Stimulating Hormone) (84443-TSH) TLB-B12, Serum-Total ONLY (60454-U98) TLB-Ferritin (82728-FER) TLB-Folic Acid (Folate) (82746-FOL) TLB-IBC Pnl (Iron/FE;Transferrin) (83550-IBC) TLB-Sedimentation  Rate (ESR) (85652-ESR) T-Sprue Panel (Celiac Disease Aby Eval) (83516x3/[address]) T-igA (11914)  Problem # 2:  HEMATOCHEZIA (ICD-578.1) Assessment: Improved she denies That rectal bleeding is a problem I did not perform rectal exam at this time. In her clinical course she may need a rectal exam and colonoscopy.t   Problem # 3:  SEXUALLY TRANSMITTED DISEASE, EXPOSURE TO (ICD-V01.6) Assessment: Unchanged continue chronic acyclovir therapy as per Dr. Oliver Barre  Problem # 4:  RENAL STONE (ICD-592.9) Assessment: Comment Only  Problem # 5:  HYPERTENSION, BENIGN (ICD-401.1) Assessment: Improved blood pressure today is normal at 118/74 and eventually continue her metaprolol as per primary care.  Patient Instructions: 1)  Copy sent to :  2)  Please continue current medications.  3)  labs pending 4)  Florstar for several weeks per clindamycin exposure 5)  Please call our GI Office back in 2 weeks with a follow-up of symptoms. 6)  Lactose Free Diet handout given.

## 2010-07-09 NOTE — Assessment & Plan Note (Signed)
Summary: birth control/eo   Vital Signs:  Patient Profile:   25 Years Old Female Weight:      127 pounds Temp:     98.1 degrees F Pulse rate:   69 / minute BP sitting:   122 / 78  Vitals Entered By: Jone Baseman CMA (December 22, 2007 11:44 AM)                 PCP:  Denny Levy MD  Chief Complaint:  birth control.  History of Present Illness: went to dermatologist for fronto-temporal hair loss. he thought it was related to her depo shot. She bought some over the counter "detx" and is taking that.  Has not had any new hair products or procedures and her dermatologist was pretty sure tthat hair loss related to depo  Wants to discuss birth controll options. Was on nuva ring and did ok---cannot remember why she stopped it. At one time was on OCP and had increase in BP. Really does not want to take something every day but unhappy with the hair falling out so wants off depo.Also liked not having a very heavy or regular period w depo.    Current Allergies: FLAGYL (METRONIDAZOLE)      Physical Exam  Scalp shows fronto temporal hair loss bilaterally. rest of scalp looks normal    Impression & Recommendations:  Problem # 1:  CONTRACEPTIVE MANAGEMENT (ICD-V25.09) discussed at length. Not a candidate for implanon as may have similar effect on hair. hair loss is a described side effect of depo but very unusual. With hx of elevated Bp on ocp, we agreed to return to nuva ring.  Orders: [facility]- Est Level  3 (40981)   Complete Medication List: 1)  Acyclovir 400 Mg Tabs (Acyclovir) .... Take 1 tablet twice a day 2)  Metoprolol Tartrate 50 Mg Tabs (Metoprolol tartrate) .Marland Kitchen.. 1 by mouth two times a day 3)  Colace 100 Mg Caps (Docusate sodium) .... One tab by mouth two times a day x6 weeks 4)  Anusol-hc 2.5 % Crea (Hydrocortisone) .... Apply to anal area twice a day 5)  Drysol 20 % Soln (Aluminum chloride) .... Apply to affected area at bedtime 6)  Nuvaring 0.12-0.015 Mg/24hr Ring  (Etonogestrel-ethinyl estradiol) .... Insert ring pervagina on or before day 5 of period; remove after three weeks and insert new ring 7 days later    Prescriptions: NUVARING 0.12-0.015 MG/24HR  RING (ETONOGESTREL-ETHINYL ESTRADIOL) insert ring pervagina on or before day 5 of period; remove after three weeks and insert new ring 7 days later  #1 month x 12   Entered and Authorized by:   Denny Levy MD   Signed by:   Denny Levy MD on 12/22/2007   Method used:   Electronically sent to ...       [facility] [address]       [address]       [place], [place]  [address]       Ph: [phone]       Fax: [phone]   RxID:   [id number]  ]

## 2010-07-09 NOTE — Progress Notes (Signed)
Summary: Rx  Phone Note Call from Patient Call back at Work Phone [phone]   Summary of Call: Wants to see when she is supposed to start taking birth control. Initial call taken by: Haydee Salter,  January 05, 2008 11:40 AM  Follow-up for Phone Call        last depo over 3 months ago. no periods. instructions reference putting it in during day 5 of menses. spoke with Dr. Leveda Anna. to put in now and follow directions from there-remove after 3 weeks and then replace with a new one 7 days after the last one was removed. to use condoms or other method of backup birth control for the first 2 weeks. pt verbalized understanding. Follow-up by: Golden Circle RN,  January 05, 2008 11:45 AM

## 2010-07-09 NOTE — Progress Notes (Signed)
Summary: Rx correction  Elizabeth Ponce was needing  her metoprolol to be prescribed for 90 day supply.  When she picked up the rx she noticed that she didn't have that, only 30 day.  She would like for this to be changed to 90 because it is more cost effective for her to have for that amount. [name]  June 07, 2008 3:49 PM  DEAR RED TEAM:  Rip Harbour it is sent let her know  Denny Levy MD  June 08, 2008 8:37 AM  Prescriptions: METOPROLOL TARTRATE 50 MG TABS (METOPROLOL TARTRATE) 1 by mouth two times a day  #180 x 3   Entered and Authorized by:   Denny Levy MD   Signed by:   Denny Levy MD on 06/08/2008   Method used:   Electronically to        [facility] [address] (retail)       [phone]       [place], [place]  [address]       Ph: [phone]       Fax: [phone]   RxID:   [id number]   message left with mother that new Rx for 90 day supply has been sent to pharmacy.  Elizabeth Lo RN  June 08, 2008 8:55 AM

## 2010-07-09 NOTE — Assessment & Plan Note (Signed)
Summary: ALLERGIC REACTION/KH   Vital Signs:  Patient Profile:   25 Years Old Female Weight:      125 pounds Pulse rate:   76 / minute BP sitting:   137 / 89  Pt. in pain?   no  Vitals Entered By: Arlyss Repress CMA, (Oct 23, 2006 2:42 PM)              Is Patient Diabetic? No   PCP:  REBECCA BASSETT MD  Chief Complaint:  very itchy rash on abdomen and back x 2 weeks. started after using drysol.  History of Present Illness: Rash for 2 weeks, getting better, very itchy.  Thinks it may be related to drysol solution, since the rash is in her groin area, but rash is also on her trunk. Did not change other products or detergents.  Has not been ill with a viral infection, rash crosses midline, has been using hydrocortisone OTC.       Review of Systems      See HPI   Physical Exam  General:     Well-developed,well-nourished,in no acute distress; alert,appropriate and cooperative throughout examination Skin:     Macular, papular rash on trunk and inner aspects of thighs.    Impression & Recommendations:  Problem # 1:  SKIN RASH, ALLERGIC (ICD-692.9) Unknown cause, either new antiperspirant product or viral xamthum.  Hydrocortisone 2.5 % two times a day for 10 days.  Return if worsens. Orders: [facility]- Est Level  2 ([address])    Patient Instructions: 1)  Hypoallergic skin products, stop Drysol for now.

## 2010-07-09 NOTE — Assessment & Plan Note (Signed)
Summary: depo shot/neal/el  Nurse Visit    Prior Medications: ACYCLOVIR 400 MG TABS (ACYCLOVIR) Take 1 tablet twice a day METOPROLOL TARTRATE 50 MG TABS (METOPROLOL TARTRATE) 1 by mouth two times a day COLACE 100 MG CAPS (DOCUSATE SODIUM) one tab by mouth two times a day x6 weeks ANUSOL-HC 2.5 %  CREA (HYDROCORTISONE) apply to anal area twice a day DRYSOL 20 %  SOLN (ALUMINUM CHLORIDE) apply to affected area at bedtime Current Allergies: FLAGYL (METRONIDAZOLE)    Medication Administration  Injection # 1:    Medication: Depo-Provera 150mg     Diagnosis: CONTRACEPTIVE MANAGEMENT (ICD-V25.09)    Route: IM    Site: R deltoid    Exp Date: 09/2009    Lot #: [id number]    Mfr: pfizer    Comments: Pt instructed to return for next depo injection 10/05/07 - 10/19/07.    Patient tolerated injection without complications    Given by: Clide Dales, GTCC/SMA, Jacki Cones, RN  Orders Added: 1)  Depo-Provera 150mg  [J1055] 2)  Est Level 1- [facility] [[id number]]    ]  Medication Administration  Injection # 1:    Medication: Depo-Provera 150mg     Diagnosis: CONTRACEPTIVE MANAGEMENT (ICD-V25.09)    Route: IM    Site: R deltoid    Exp Date: 09/2009    Lot #: [id number]    Mfr: Fransisca Kaufmann    Comments: Pt instructed to return for next depo injection 10/05/07 - 10/19/07.    Patient tolerated injection without complications    Given by: Clide Dales, Charlotte Crumb, RN  Orders Added: 1)  Depo-Provera 150mg  [J1055] 2)  Est Level 1- [facility] [[id number]]

## 2010-07-09 NOTE — Assessment & Plan Note (Signed)
Summary: new / [facility] / # / cd   Vital Signs:  Patient profile:   25 year old female LMP:     02/23/2009 Height:      63 inches Weight:      119 pounds Temp:     99.2 degrees F oral Pulse rate:   103 / minute BP sitting:   112 / 74  (left arm)  CC: new pt to est. Is Patient Diabetic? No LMP (date): 02/23/2009     Enter LMP: 02/23/2009 Last PAP Result normal   Primary Care Jaylanni Eltringham:  Denny Levy MD  CC:  new pt to est..  History of Present Illness: here with recurrent "gas" issues with bloating and what she consider excessive passing gas from below; and "smel;l" of gas "coming up";  no abd pain , has seen some trace BRBPR with ? 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  Only gets rare heartburn, no dyphagia.    Preventive Screening-Counseling & Management  Alcohol-Tobacco     Smoking Status: never  Problems Prior to Update: 1)  Hematochezia  (ICD-578.1) 2)  Unspecified Hemorrhoids With Other Complication  (ICD-455.8) 3)  Preventive Health Care  (ICD-V70.0) 4)  Vaginitis  (ICD-616.10) 5)  Hair Loss  (ICD-704.00) 6)  Sexually Transmitted Disease, Exposure To  (ICD-V01.6) 7)  Lactose Intolerance  (ICD-271.3) 8)  Hyperhidrosis  (ICD-780.8) 9)  Renal Stone  (ICD-592.9) 10)  Hypertension, Benign  (ICD-401.1) 11)  Contraceptive Management  (ICD-V25.09)  Medications Prior to Update: 1)  Acyclovir 400 Mg Tabs (Acyclovir) .... Two Times A Day 2)  Metoprolol Tartrate 50 Mg Tabs (Metoprolol Tartrate) .Marland Kitchen.. 1 By Mouth Two Times A Day  Current Medications (verified): 1)   Acyclovir 400 Mg Tabs (Acyclovir) .... Two Times A Day 2)  Metoprolol Tartrate 50 Mg Tabs (Metoprolol Tartrate) .Marland Kitchen.. 1 By Mouth Two Times A Day  Allergies: 1)  ! * Elidil 2)  Flagyl (Metronidazole)  Past History:  Family History: Last updated: 03/16/2009 cad--mom 30s mi and CABG in her 60's, dm--uncle, htn--mom and dad  Social History: Last updated: 03/16/2009 No smoking Denies sexual activity Occupation: - Consulting civil engineer - [employer] Alcohol use-yes Single no children  Risk Factors: Smoking Status: never (03/16/2009)  Past Medical History: depo for bc, frequent vaginitis, kidney stone with litho and stent, urinary frequency HTN  Past Surgical History: s/p stone retreival procedure x 2  - 1999 - in [place]  Family History: Reviewed history from 08/06/2006 and no changes required. cad--mom 30s mi and CABG in her 80's, dm--uncle, htn--mom and dad  Social History: Reviewed history from 08/17/2008 and no changes required. No smoking Denies sexual activity Occupation: - student - [employer] Alcohol use-yes Single no children  Review of Systems       all otherwise negative per pt   Physical Exam  General:  alert and well-developed.   Head:  normocephalic and atraumatic.   Eyes:  vision grossly intact, pupils equal, and pupils round.   Ears:  R ear normal and L ear normal.  Nose:  no external deformity and no nasal discharge.   Mouth:  no gingival abnormalities and pharynx pink and moist.   Neck:  supple and no masses.   Lungs:  normal respiratory effort and normal breath sounds.   Heart:  normal rate and regular rhythm.   Abdomen:  soft, non-tender, and normal bowel sounds.   Msk:  no joint tenderness and no joint swelling.   Extremities:  no edema, no erythema  Neurologic:  cranial nerves II-XII intact and strength normal in all extremities.     Impression & Recommendations:  Problem # 1:  HEMATOCHEZIA (ICD-578.1)  evidence for IBD not overwhelming, but with recurrent  constipation, hx of BRBPR, wt loss and other issues as per HPI will defer to pt and refer to GI; to check labs today  Orders: TLB-BMP (Basic Metabolic Panel-BMET) (80048-METABOL) TLB-CBC Platelet - w/Differential (85025-CBCD) TLB-Hepatic/Liver Function Pnl (80076-HEPATIC) Gastroenterology Referral (GI)  Problem # 2:  HYPERTENSION, BENIGN (ICD-401.1)  Her updated medication list for this problem includes:    Metoprolol Tartrate 50 Mg Tabs (Metoprolol tartrate) .Marland Kitchen... 1 by mouth two times a day  BP today: 112/74 Prior BP: 105/73 (08/17/2008) stable overall by hx and exam, ok to continue meds/tx as is   Complete Medication List: 1)  Acyclovir 400 Mg Tabs (Acyclovir) .... Two times a day 2)  Metoprolol Tartrate 50 Mg Tabs (Metoprolol tartrate) .Marland Kitchen.. 1 by mouth two times a day  Other Orders: TLB-TSH (Thyroid Stimulating Hormone) [phone])  Patient Instructions: 1)  Please go to the Lab in the basement for your blood tests today 2)  Continue all previous medications as before this visit  3)  You will be contacted about the referral(s) to: GI 4)  Please schedule a follow-up appointment as needed.

## 2010-07-09 NOTE — Assessment & Plan Note (Signed)
Summary: discharge, other questions   Vital Signs:  Patient profile:   25 year old female Height:      63.5 inches Weight:      120.2 pounds BMI:     21.03 Temp:     98.2 degrees F Pulse rate:   64 / minute BP sitting:   105 / 73  (left arm)  Vitals Entered By: Theresia Lo RN (August 17, 2008 10:23 AM) CC: yellow vaginal discharge Is Patient Diabetic? No Pain Assessment Patient in pain? no        History of Present Illness: Had a yeast infection a month ago, took 1 fluconazole and felt better, but not sure everything is cleared up.  Now continues to have yellow discharge, no odor, about the same daily.  Not sexually active for 3 years.  No itching or burning now.  Recently stopped using NuvaRing.  Pt reports she has no concerns about possiblity of STIs.  Pt also asks about fecal odor.  States not a problem now, but has noticed in the past.  Had problems with hemorrhoids in the past, but not currently.  Read on internet about this, worried it can be an internal problem.  Also with breast lump over 10 years ago that was evaluated by mammogram and needle aspiration.  Was told all was fine, but it unsure if she has  a lump now.  Would like exam today.  Also reports that she has had excessive groin sweating in the past, but it has improved.  Used to leave damp spots in chairs after sitting for awhile.  No other problems with excessive sweating in other locations.     Habits & Providers     Tobacco Status: never  Current Medications (verified): 1)  Acyclovir 400 Mg Tabs (Acyclovir) .... Two Times A Day 2)  Metoprolol Tartrate 50 Mg Tabs (Metoprolol Tartrate) .Marland Kitchen.. 1 By Mouth Two Times A Day  Allergies (verified): 1)  ! * Elidil 2)  Flagyl (Metronidazole)  Social History:    Works at [employer]; No smoking/alcohol    Denies sexual activity  Review of Systems       see HPI  Physical Exam  General:  No distress.  Pleasant. Breasts:  Fibrocystic changes B breasts, but  otherwise no mass, nodules, thickening, tenderness, bulging, retraction, inflamation, nipple discharge or skin changes noted.  No axillary LAD Genitalia:  Normal introitus for age, no external lesions, no vaginal discharge, mucosa pink and moist, no vaginal lesions, no vaginal atrophy, no friaility or hemorrhage, normal uterus size and position, no adnexal masses or tenderness.  Cervix with erythema, yellow discharge.  Pap done.  No CMT   Impression & Recommendations:  Problem # 1:  VAGINITIS (ICD-616.10) Wet prep concerning for infection given WBCs.  No TTP so unlikely PID.  Pt reports no risk factors for STIs, but given exam, will send urine gc/cz.  Pt agrees with plan. Orders: Wet Prep- [facility] [id number]) Pap Smear-[facility] ([id number]) GC/Chlamydia-[facility] ([id number]) [facility]- Est Level  3 ([id number])  Problem # 2:  HYPERHIDROSIS (ICD-780.8)  Reviewed possible treatments with pt (inlcuding botox injections).  Pt will discuss with her dermatologist, but she is doing much better than she was in the past.  Orders: [facility]- Est Level  3 ([id number])  Problem # 3:  UNSPECIFIED HEMORRHOIDS WITH OTHER COMPLICATION (ICD-455.8)  Pt without hemorrhoids currently.  Reviewed hygeine if they do recur and advised trying witch hazel wipes if needed.  Orders: [facility]- Est Level  3 ([id number])  Complete Medication List: 1)  Acyclovir 400 Mg Tabs (Acyclovir) .... Two times a day 2)  Metoprolol Tartrate 50 Mg Tabs (Metoprolol tartrate) .Marland Kitchen.. 1 by mouth two times a day  Laboratory Results  Date/Time Received: August 17, 2008 11:28 AM  Date/Time Reported: August 17, 2008 11:37 AM   [employer] Source: vag WBC/hpf: loaded Bacteria/hpf: 3+   rods and cocci Clue cells/hpf: none  Negative whiff Yeast/hpf: none Trichomonas/hpf: none Comments: ...............test performed by......Marland KitchenBonnie A. [place], [place] (ASCP)

## 2010-07-09 NOTE — Assessment & Plan Note (Signed)
Summary: depo shot/neal/el  Nurse Visit   Complete Medication List: 1)  Acyclovir 400 Mg Tabs (Acyclovir) .... Take 1 tablet twice a day 2)  Metoprolol Tartrate 50 Mg Tabs (Metoprolol tartrate)    Prior Medications: ACYCLOVIR 400 MG TABS (ACYCLOVIR) Take 1 tablet twice a day METOPROLOL TARTRATE 50 MG TABS (METOPROLOL TARTRATE)  Current Allergies: FLAGYL (METRONIDAZOLE)    Medication Administration  Injection # 3:    Medication: Depo-Provera 150mg     Diagnosis: CONTRACEPTIVE MANAGEMENT (ICD-V25.09)    Route: IM    Site: L deltoid    Exp Date: 05/2009    Lot #: [id number]    Mfr: pfizer    Patient tolerated injection without complications    Given by: Theresia Lo RN (April 26, 2007 4:07 PM)  Orders Added: 1)  Depo-Provera 150mg  [J1055] 2)  Est Level 1- [facility] [[id number]]    ]  Medication Administration  Injection # 3:    Medication: Depo-Provera 150mg     Diagnosis: CONTRACEPTIVE MANAGEMENT (ICD-V25.09)    Route: IM    Site: L deltoid    Exp Date: 05/2009    Lot #: [id number]    Mfr: pfizer    Patient tolerated injection without complications    Given by: Theresia Lo RN (April 26, 2007 4:07 PM)  Orders Added: 1)  Depo-Provera 150mg  [J1055] 2)  Est Level 1- [facility] [[id number]]   next Depo Provera due 07/12/07 thru 07/26/07    Appended Document: depo shot/neal/el Depro Provera 150 mg was given only once  at 4:07 PM

## 2010-07-09 NOTE — Progress Notes (Signed)
Summary: Refills  Phone Note Refill Request Call back at [phone] Message from:  Patient  is wanting her rx's changed to 90 day supply [facility] in [place], is needing rx's asap.  acyclovir 200mg  is covered on 4dollar plan, but 400mg  isn't.  Initial call taken by: Haydee Salter,  June 05, 2008 1:40 PM  Follow-up for Phone Call        DEAR RED TEAM:  I will send in a rx--the PROBLEM is that 200 mg ONCE a day is covered and she has been taking 400 two times a day. Now the GOOD NEWS is that after one year of suppression (which she has been on more or less) you CAN try a lower dose. So let's call in acyclovir as below and see how she does. Thanks! PS I sent it electronically.  SARA NEAL MD  June 05, 2008 3:43 PM    Additional Follow-up for Phone Call Additional follow up Details #1::        she is not comfortable w that so will leave dose as is SARA NEAL MD  June 05, 2008 4:25 PM     New/Updated Medications: ACYCLOVIR 200 MG CAPS (ACYCLOVIR) 1 by mouth qd ACYCLOVIR 400 MG TABS (ACYCLOVIR) two times a day   Prescriptions: ACYCLOVIR 400 MG TABS (ACYCLOVIR) two times a day  #60 x 12   Entered and Authorized by:   Denny Levy MD   Signed by:   Denny Levy MD on 06/05/2008   Method used:   Electronically to        [facility] [address] (retail)       [phone]       [place], [place]  [address]       Ph: [phone]       Fax: [phone]   RxID:   [id number] ACYCLOVIR 200 MG CAPS (ACYCLOVIR) 1 by mouth qd  #90 x 3   Entered and Authorized by:   Denny Levy MD   Signed by:   Denny Levy MD on 06/05/2008   Method used:   Electronically to        [facility] [address] (retail)       [phone]       [place], [place]  [address]       Ph: [phone]       Fax: [phone]   RxID:   [id number]

## 2010-07-09 NOTE — Progress Notes (Signed)
Summary: returning RNs call  Phone Note Call from Patient Call back at [phone]   Summary of Call: pt would like results of chest xray Initial call taken by: Haydee Salter,  December 21, 2006 12:17 PM  Follow-up for Phone Call        shows clearing of her abnormality on CXR-- this is good as long as she is feeling well, no further repeat x rays necessary  ...................................................................Denny Levy MD  December 21, 2006 2:28   Additional Follow-up for Phone Call Additional follow up Details #1::        message left to call back. Additional Follow-up by: Theresia Lo RN,  December 21, 2006 2:37 PM    Additional Follow-up for Phone Call Additional follow up Details #2::    pt is returning call Follow-up by: ERIN LEVAN,  December 21, 2006 4:08 PM  Additional Follow-up for Phone Call Additional follow up Details #3:: Details for Additional Follow-up Action Taken: Phone call to pt- no answer, left voicemail to return call.   pt is returning call [phone] 12/22/2006 @ 11:30am WHITNEY Additional Follow-up by: AMY MARTIN RN,  December 21, 2006 4:50 PM  Spoke with pt - gave her above message from Dr. Jennette Kettle.

## 2010-07-09 NOTE — Progress Notes (Signed)
Summary: [place] request   Phone Note Call from Patient Call back at [phone]   Reason for Call: Talk to Nurse Summary of Call: Pt is requesting to speak with an rn about possibly having pneumonia again and being lactos and tolerant. Initial call taken by: Haydee Salter,  February 02, 2007 3:53 PM  Follow-up for Phone Call        left message Follow-up by: Golden Circle RN,  February 02, 2007 4:03 PM  Additional Follow-up for Phone Call Additional follow up Details #1::        left message Additional Follow-up by: Golden Circle RN,  February 02, 2007 4:38 PM    Additional Follow-up for Phone Call Additional follow up Details #2::    she thinks she has pnumonia again.same symptoms as before. declined appt today. made appt next day. pcp not available. also will need depo. wants to know if she is lactos intolerant. relates pain & gas with dairy Follow-up by: Golden Circle RN,  February 03, 2007 9:59 AM

## 2010-07-09 NOTE — Progress Notes (Signed)
Summary: status of rx  Phone Note Call from Patient Call back at Work Phone [phone]   Reason for Call: Refill Medication Summary of Call: pt is checking status of rx for acyclovir, [facility] Initial call taken by: Knox Royalty,  September 14, 2007 3:08 PM  Follow-up for Phone Call        Will forward to MD Follow-up by: ASHA BENTON LPN,  September 14, 2007 3:20 PM  Additional Follow-up for Phone Call Additional follow up Details #1::        done  ...................................................................SARA NEAL MD  September 14, 2007 3:43 PM       Prescriptions: ACYCLOVIR 400 MG TABS (ACYCLOVIR) Take 1 tablet twice a day  #60 x 12   Entered and Authorized by:   Denny Levy MD   Signed by:   Denny Levy MD on 09/14/2007   Method used:   Electronically sent to ...       [facility] [address]       [address]       [place], [place]  [address]       Ph: [phone]       Fax: [phone]   RxID:   [id number]

## 2010-07-09 NOTE — Progress Notes (Signed)
Summary: test results  Phone Note Call from Patient Call back at Home Phone [phone]   Caller: Patient Call For: Dr. Jarold Motto Reason for Call: Lab or Test Results Summary of Call: pt would like results from "about 5 different tests" Initial call taken by: Vallarie Mare,  March 26, 2009 11:57 AM  Follow-up for Phone Call        All lab tests normal. Pt notified.   Follow-up by: Ashok Cordia RN,  March 26, 2009 1:32 PM

## 2010-07-09 NOTE — Progress Notes (Signed)
Summary: lab results  Phone Note Call from Patient Call back at Work Phone [phone]   Reason for Call: Lab or Test Results Summary of Call: pt is requesting the results of her lab tests Initial call taken by: ERIN LEVAN,  May 05, 2007 10:17 AM  Follow-up for Phone Call        Phone Call Completed; let pt know that her Hgb was normal and HIV test was negative. Also informed her that urine sent for culture.  Follow-up by: Lequita Asal  MD,  May 05, 2007 10:22 AM

## 2010-07-09 NOTE — Progress Notes (Signed)
Summary: Patient called to check status of her record request  Patient called to check status of her record request. Patient states that her Insurance company has not received records. I advised the patient to have her [employer] contact our [employer]. Dena Chavis  July 27, 2009 1:07 PM

## 2010-07-09 NOTE — Progress Notes (Signed)
Summary: status of note for chest x-ray  Phone Note Call from Patient Call back at Work Phone [phone]   Reason for Call: Talk to Nurse Summary of Call: pt sts she was suppose to come in the office yesterday to pick up a note to have a chest x-ray done, she wants to know if she can just come in on Thursday? Initial call taken by: ERIN LEVAN,  December 15, 2006 4:07 PM  Follow-up for Phone Call        Spoke with pt. states will be in tomorrow to pick up order for chest X-ray. Chest X-ray order @ front desk Follow-up by: Tomasa Rand,  December 16, 2006 10:01 AM

## 2010-07-09 NOTE — Assessment & Plan Note (Signed)
Summary: FOLLOW UP/SP   History of Present Illness Visit Type: Follow-up Visit Primary GI MD: Sheryn Bison MD FACP FAGA Primary Lucillia Corson: Linus Mako, MD Requesting Irvin Bastin: n/a Chief Complaint: Pt states even after stopping the clindamycin she is still having loose soft stools and more urgent BM's. Pt tried taking the Florastor but had some side effects with the med and stopped it.  History of Present Illness:   This patient is a 25 year old black female it continues with gas, bloating and diarrhea. She has known lactose intolerance and is using Lactaid tablets on a p.r.n. basis. All her labs were negative except for an elevated serum IgG anti-gliadin antibody consistent with celiac sensitivity. A trial of Florstar probiotic spur previous history of brief exposure to clindamycin was not helpful. She certainly has no symptoms of weight loss, or systemic complaints.   GI Review of Systems    Reports bloating.      Denies abdominal pain, acid reflux, belching, chest pain, dysphagia with liquids, dysphagia with solids, heartburn, loss of appetite, nausea, vomiting, vomiting blood, weight loss, and  weight gain.      Reports diarrhea.     Denies anal fissure, black tarry stools, change in bowel habit, constipation, diverticulosis, fecal incontinence, heme positive stool, hemorrhoids, irritable bowel syndrome, jaundice, light color stool, liver problems, rectal bleeding, and  rectal pain.    Current Medications (verified): 1)  Acyclovir 400 Mg Tabs (Acyclovir) .... Two Times A Day 2)  Metoprolol Tartrate 50 Mg Tabs (Metoprolol Tartrate) .Marland Kitchen.. 1 By Mouth Two Times A Day  Allergies (verified): 1)  ! * Elidil 2)  Flagyl (Metronidazole)  Past History:  Past medical, surgical, family and social histories (including risk factors) reviewed for relevance to current acute and chronic problems.  Past Medical History: Reviewed history from 03/16/2009 and no changes required. depo for bc,  frequent vaginitis, kidney stone with litho and stent, urinary frequency HTN  Past Surgical History: Reviewed history from 03/16/2009 and no changes required. s/p stone retreival procedure x 2  - 1999 - in [place]  Family History: Reviewed history from 03/19/2009 and no changes required. cad--mom 30s mi and CABG in her 40's, dm--uncle, htn--mom and dad No FH of Colon Cancer:  Social History: Reviewed history from 03/16/2009 and no changes required. No smoking Denies sexual activity Occupation: - student - [employer] Alcohol use-yes Single no children  Review of Systems  The patient denies allergy/sinus, anemia, anxiety-new, arthritis/joint pain, back pain, blood in urine, breast changes/lumps, change in vision, confusion, cough, coughing up blood, depression-new, fainting, fatigue, fever, headaches-new, hearing problems, heart murmur, heart rhythm changes, itching, menstrual pain, muscle pains/cramps, night sweats, nosebleeds, pregnancy symptoms, shortness of breath, skin rash, sleeping problems, sore throat, swelling of feet/legs, swollen lymph glands, thirst - excessive , urination - excessive , urination changes/pain, urine leakage, vision changes, and voice change.    Vital Signs:  Patient profile:   25 year old female Height:      63 inches Weight:      116.25 pounds BMI:     20.67 Pulse rate:   70 / minute Pulse rhythm:   regular BP sitting:   108 / 64  (right arm) Cuff size:   regular  Vitals Entered By: Christie Nottingham CMA (AAMA) (March 30, 2009 10:12 AM)  Physical Exam  General:  Well developed, well nourished, no acute distress.healthy appearing.   Head:  Normocephalic and atraumatic. Eyes:  PERRLA, no icterus.exam deferred to patient's ophthalmologist.   Psych:  Alert  and cooperative. Normal mood and affect.   Impression & Recommendations:  Problem # 1:  GLUTEN ENTEROPATHY (ICD-579.0) Assessment Unchanged Her antibodies are not classic for celiac disease  but these may vary depending on the level of gluten exposure. I placed her on gluten-free diet and we will see how she does symptomatically for 6-8 weeks. She may need endoscopy, small bowel biopsy, and colonoscopy. However, her symptoms are most consistent with carbohydrate malabsorption.  Problem # 2:  RECTAL BLEEDING (ICD-569.3) This is no longer a problem and her CBC was normal.  Problem # 3:  LACTOSE INTOLERANCE (ICD-271.3) Assessment: Improved Continued use of p.r.n. Lactaid tablets and avoidance of sorbitol and fructose..  Patient Instructions: 1)  Celiac Sprue brochure given. 2)  Referral to Dietary for gluten free diet trial for 6-8 weeks. Consider endoscopy and small bowel biopsy 3)  Celiac Sprue brochure given.  4)  Lactose Intolerance brochure given.   Appended Document: Orders Update    Clinical Lists Changes  Orders: Added new Test order of [facility] ([facility]) - Signed

## 2010-07-09 NOTE — Assessment & Plan Note (Signed)
Summary: sweating/wk   Vital Signs:  Patient Profile:   25 Years Old Female Weight:      124 pounds Pulse rate:   90 / minute BP sitting:   126 / 78  (left arm)  Pt. in pain?   no  Vitals Entered By: Arlyss Repress CMA, (September 15, 2006 9:05 AM)              Is Patient Diabetic? No   PCP:  REBECCA BASSETT MD  Chief Complaint:  1.)sweating in vaginal area x 2-3 months 2.) right thumb injury 3 weeks ago and Dysuria.  History of Present Illness: Since Last summer urinary freq. every 2 hours.    Underwear feels wet-- sweating alot in vaginal area for past year.   No rash or itching.   no dysuria or blood.  No abdominal pain.  No vaginal discharge.  Few months ago jammed L.  thumb at the club.  Still some pain in prox phalanx.  Hurts to push.  no swelling or mechanical sx.     Dysuria      This is a 25 year old woman who presents with Dysuria.  The patient reports urinary frequency and urgency, but denies burning with urination, hematuria, vaginal discharge, vaginal itching, and vaginal sores.  The patient denies the following associated symptoms: nausea, vomiting, fever, shaking chills, flank pain, abdominal pain, back pain, pelvic pain, and arthralgias.  The patient denies the following risk factors: diabetes, prior antibiotics, immunosuppression, history of GU anomaly, history of pyelonephritis, pregnancy, and analgesic abuse.  History is significant for no urinary tract problems.      Past Medical History:    Reviewed history from 08/06/2006 and no changes required:       depo for bc, frequent vaginitis, kidney stone with litho and stent, urinary frequency   Social History:    Works at [employer]; No smoking/alcohol    Sexually active-- on depo   Risk Factors:  Tobacco use:  never  PAP Smear History:     Date of Last PAP Smear:  06/15/2006    Results:  normal     Physical Exam  General:     Well-developed,well-nourished,in no acute distress; alert,appropriate and  cooperative throughout examination Lungs:     Normal respiratory effort, chest expands symmetrically. Lungs are clear to auscultation, no crackles or wheezes. Heart:     Normal rate and regular rhythm. S1 and S2 normal without gallop, murmur, click, rub or other extra sounds. Abdomen:     Bowel sounds positive,abdomen soft and non-tender without masses, organomegaly or hernias noted. Genitalia:     normal introitus, no external lesions. vaginal discharge--white without odor.    Some sweating of clothing.   Msk:     both thumbs nt with full range of motion and strength.  UCl intact to stress testing.  IP collateral lig intact.     Problems:  Medical Problems Added: 1)  Dx of Hyperhidrosis  (ICD-780.8) 2)  Dx of Cold Sore  (ICD-054.9) 3)  Dx of Renal Stone  (ICD-592.9) 4)  Dx of Hypertension, Benign  (ICD-401.1) 5)  Dx of Joint Pain, Hand  (ICD-719.44) 6)  Dx of Vaginitis Nos  (ICD-616.10) 7)  Dx of Symptom, Frequency, Urinary  (ICD-788.41)   Impression & Recommendations:  Problem # 1:  SYMPTOM, FREQUENCY, URINARY (ICD-788.41) Possibly due to uti.  Given cipro 500 mg two times a day x3 days.  Follow.   Orders: [facility]- Est  Level 4 ([address]109)  Urinalysis-[facility] (00000)   Problem # 2:  VAGINITIS NOS (ICD-616.10) Some dc.  Gc/chlamydia pending. Orders: [facility]- Est  Level 4 (99214) Wet Prep- [facility] (27253) GC/Chlamydia-[facility] (87591/87491)   Problem # 3:  JOINT PAIN, HAND (ICD-719.44) Mild thumb sprain.  UCL intact.  Should improve.  May tape ip joint for pain relief or nsaids if needed.   Orders: [facility]- Est  Level 4 (66440)   Problem # 4:  HYPERTENSION, BENIGN (ICD-401.1) stable on metoprolol 50 two times a day.  refilled  Orders: [facility]- Est  Level 4 (99214)   Problem # 5:  COLD SORE (ICD-054.9) refill acyclovir  Problem # 6:  HYPERHIDROSIS (ICD-780.8) drysol written.  Avoid in vagiinal area but may use 2x/wk as needed.   Laboratory Results   Urine Tests  Date/Time Recieved:  September 15, 2006 10:04 AM  Date/Time Reported: September 15, 2006 10:05 AM   Routine Urinalysis   Color: yellow Appearance: Clear Glucose: negative   (Normal Range: Negative) Bilirubin: negative   (Normal Range: Negative) Ketone: negative   (Normal Range: Negative) Spec. Gravity: >=1.030   (Normal Range: 1.003-1.035) Blood: negative   (Normal Range: Negative) pH: 5.5   (Normal Range: 5.0-8.0) Protein: trace   (Normal Range: Negative) Urobilinogen: 0.2   (Normal Range: 0-1) Nitrite: negative   (Normal Range: Negative) Leukocyte Esterace: small   (Normal Range: Negative)  Urine Microscopic WBC/hpf: 10-15 RBC/hpf: rare Bacteria: 2+ Mucous: large Epithelial: 5-10    Comments: ...................................................................DONNA [facility]  September 15, 2006 10:05 AM  Date/Time Received: September 15, 2006 9:35 AM  Date/Time Reported: September 15, 2006 10:06 AM   Wet Mount/KOH Source: Vaginal WBC/hpf 10-20 Bacteria/hpf 3+  Rods Clue cells/hpf none  Negative whiff Yeast/hpf none Trichomonas/hpf none Comments ...................................................................DONNA [facility]  September 15, 2006 10:06 AM       Preventive Care Screening  Pap Smear:    Date:  06/15/2006    Next Due:  06/2007    Results:  normal   Last Tetanus Booster:    Date:  09/12/2002    Results:  given

## 2010-07-09 NOTE — Assessment & Plan Note (Signed)
Summary: break out   Vital Signs:  Patient Profile:   25 Years Old Female Weight:      123.5 pounds Temp:     99.2 degrees F Pulse rate:   62 / minute BP sitting:   144 / 91  (left arm)  Pt. in pain?   no  Vitals Entered By: Dedra Skeens CMA, (August 25, 2006 1:45 PM)                History of Present Illness: History of cold sores since adolescence.  Usually takes acyclovir 800 mg pobid for outbreaks. has a group of 2 mm vesicles on upper lip times one day.  wants note for work.  describes as her usual break out.  embarassed.  doesn't want to go to work.  never typed the virus in past.    Past Medical History:    Reviewed history from 08/06/2006 and no changes required:       depo for bc, frequent vaginitis, kidney stone with litho and stent, urinary frequency   Family History:    Reviewed history from 08/06/2006 and no changes required:       cad--mom 30s mi, dm--uncle, htn--mom and dad  Social History:    Reviewed history from 08/06/2006 and no changes required:       Works at [employer]; No smoking/alcohol     Physical Exam  General:     Well-developed,well-nourished,in no acute distress; alert,appropriate and cooperative throughout examination Head:     Normocephalic and atraumatic without obvious abnormalities. No apparent alopecia or balding. Eyes:     No corneal or conjunctival inflammation noted. EOMI. Perrla.Vision grossly normal. Mouth:     3 90mm-2mm vesicles on upper lip near philtrum. Neck:     no lad Lungs:     Normal respiratory effort, chest expands symmetrically. Lungs are clear to auscultation, no crackles or wheezes. Heart:     Normal rate and regular rhythm. S1 and S2 normal without gallop, murmur, click, rub or other extra sounds.    Impression & Recommendations:  Problem # 1:  COLD SORE (ICD-054.9) recommended continue acyclovir 800 mg pobid for therapy.  advised universal precautions such as handwashing to prevent spread.  gave dr note  for work today. however, did discuss with patient that I did not feel it is medically indicated to write a note for extended time off for cold sore.  told the patient that if the very small blister becomes very noticeable and embarassing we may write a note on a one day basis.  Compared to cold sore to acne outbreak.  Stated that we cannot write out of work notes based on embarassment.  Reassured her this is very common in the population and should not carry any stigma with it. Orders: [facility]- Est Level  3 (81191)

## 2010-07-09 NOTE — Progress Notes (Signed)
Summary: referral  Phone Note Call from Patient Call back at Work Phone [phone]   Reason for Call: Talk to Doctor Summary of Call: pt is requesting a referral to the dermotologist for her eczema, sts she has been referred before but she lost the info & she also just got health insurance. Pt also needs referral for an eye doctor for eye exam Initial call taken by: ERIN LEVAN,  Oct 19, 2006 4:07 PM  Follow-up for Phone Call        ok-- any derm is fine-- derm or [facility] or [facility].  can see optometrist for eye exam without referral.  if wants to see opthomologist would try [facility].  thanks Follow-up by: Albertha Ghee MD,  Oct 20, 2006 8:14 AM  Additional Follow-up for Phone Call Additional follow up Details #1::        Phone call to Dr. Sherryl Barters office to scheduled appt with Joneen Caraway PA for 11/12/06 at 1:30pm.  Pt notified of appt via phone.  Also, advised her she can make her own optometrist for eye exam and advised that Dr. Cleophas Dunker suggests [facility] for opthomology.  Additional Follow-up by: AMY MARTIN RN,  Oct 20, 2006 12:29 PM

## 2010-07-09 NOTE — Assessment & Plan Note (Signed)
Summary: depo wp  Nurse Visit   Vitals Entered By: Alphia Kava (Oct 13, 2007 9:12 AM)                 Prior Medications: ACYCLOVIR 400 MG TABS (ACYCLOVIR) Take 1 tablet twice a day METOPROLOL TARTRATE 50 MG TABS (METOPROLOL TARTRATE) 1 by mouth two times a day COLACE 100 MG CAPS (DOCUSATE SODIUM) one tab by mouth two times a day x6 weeks ANUSOL-HC 2.5 %  CREA (HYDROCORTISONE) apply to anal area twice a day DRYSOL 20 %  SOLN (ALUMINUM CHLORIDE) apply to affected area at bedtime Current Allergies: FLAGYL (METRONIDAZOLE)    Medication Administration  Injection # 1:    Medication: Depo-Provera 150mg     Diagnosis: CONTRACEPTIVE MANAGEMENT (ICD-V25.09)    Route: IM    Site: L deltoid    Exp Date: 12/2009    Lot #: [id number]    Mfr: pfizer    Comments: Pt instructed to return between 7/22 - 8/5    Patient tolerated injection without complications    Given by: Alphia Kava (Oct 13, 2007 9:17 AM)  Orders Added: 1)  Depo-Provera 150mg  [J1055]    ]

## 2010-07-09 NOTE — Progress Notes (Signed)
Summary: Triage  Phone Note Call from Patient Call back at Work Phone [phone]   Reason for Call: Talk to Nurse Summary of Call: pt is requesting to speak with RN, pt is having flu symptoms Initial call taken by: Knox Royalty,  January 12, 2008 8:33 AM  Follow-up for Phone Call        runny nose, HA, nausea, loss of appetite, diarrhea. started Sunday. afebrile. advised tyl or ibu for HA, otc sinus med for runny nose-use what has worked before. immodium for diarrhea. slightly better today than yesterday. told her it runs 7-10 days. advised mask if she was around people & frequent handwashing (has to go to [employer]) Has continued to work & others there are sick. to sip small amounts of ginger ale or cola that has gone flat to help with nausea. pt agreed with plan Follow-up by: Golden Circle RN,  January 12, 2008 8:35 AM

## 2010-07-09 NOTE — Progress Notes (Signed)
Summary: Depo  Phone Note Call from Patient Call back at Work Phone [phone]   Summary of Call: Pt wants to know when she started receiving depo shot. Initial call taken by: Haydee Salter,  November 29, 2007 9:02 AM  Follow-up for Phone Call        states she saw her dermatologist for hair loss. He told her it was probably from depo since the hair loss started aver a year ago & records show she has been on it since 3/08, perhaps earlier. she wants to know if she stops depo will her hair grow back? message to pcp Follow-up by: Golden Circle RN,  November 29, 2007 2:18 PM  Additional Follow-up for Phone Call Additional follow up Details #1::        DEAR RED TEAM:  her hair should grow back--I suspect it will grow back even if she continues on depo but that is her decision or she can ask teh dermatologist  Denny Levy MD  November 29, 2007 2:25 PM     Additional Follow-up for Phone Call Additional follow up Details #2::    told her above info. told her she can talk with derm and think about whether or not she wants to change depo to another method.  Follow-up by: Golden Circle RN,  November 29, 2007 2:54 PM

## 2010-07-09 NOTE — Assessment & Plan Note (Signed)
Summary: Offic visit-pneumonia

## 2010-07-09 NOTE — Progress Notes (Signed)
Summary: Refill  Phone Note Call from Patient Call back at [facility] Phone [phone]   Summary of Call: Pt needs refill on metroprolol.  She states she called the pharmacy a few days ago.  Pt uses [facility] [address]. Initial call taken by: Haydee Salter,  May 20, 2007 11:16 AM  Follow-up for Phone Call        Pt is checking status.  Needs ASAP.  Is completely out. Follow-up by: Haydee Salter,  May 21, 2007 11:21 AM  Additional Follow-up for Phone Call Additional follow up Details #1::        done electronically. ...................................................................Sharion Grieves MD  May 24, 2007 8:42 AM     New/Updated Medications: METOPROLOL TARTRATE 50 MG TABS (METOPROLOL TARTRATE) 1 by mouth two times a day   Prescriptions: METOPROLOL TARTRATE 50 MG TABS (METOPROLOL TARTRATE) 1 by mouth two times a day  #60 x 12   Entered by:   Denny Levy MD   Authorized by:   Marland Kitchen RED TEAM[facility]   Signed by:   Denny Levy MD on 05/24/2007   Method used:   Electronically sent to ...       [facility] [address]       [address]       [place], [place]  [address]       Ph: [phone]       Fax: [phone]   RxID:   [id number]     Appended Document: Refill    Clinical Lists Changes  Medications: Rx of METOPROLOL TARTRATE 50 MG TABS (METOPROLOL TARTRATE) 1 by mouth two times a day;  #60 x 12;  Signed;  Entered by: Jone Baseman CMA;  Authorized by: Denny Levy MD;  Method used: Electronic    Prescriptions: METOPROLOL TARTRATE 50 MG TABS (METOPROLOL TARTRATE) 1 by mouth two times a day  #60 x 12   Entered by:   JESSICA FLEEGER CMA   Authorized by:   Denny Levy MD   Signed by:   Jone Baseman CMA on 05/24/2007   Method used:   Electronically sent to ...       [facility] [address]       [address]       [place], [place]  [address]       Ph: [phone]       Fax:  [phone]   RxID:   [id number]   Resent because 1st refill did not got thru ...................................................................JESSICA [facility] CMA  May 24, 2007 8:44 AM

## 2010-07-09 NOTE — Letter (Signed)
Summary: Generic Letter  [facility]  [address]   [place], [place] [address]   Phone: [phone]  Fax:     09/16/2006  [address] [place], [place]  [address]  Dear Ms. Harvin Hazel,  [facility] you are feeling better.  Testing for chlamydia and gonorrhea was all negative.  Yeah!     Sincerely,   REBECCA BASSETT MD [facility]  Appended Document: Generic Letter Patient letter mailed

## 2010-07-09 NOTE — Progress Notes (Signed)
Summary: Dr's note  Phone Note Call from Patient Call back at [phone]   Summary of Call: pt would like to speak with someone about a dr's note Initial call taken by: Haydee Salter,  August 25, 2006 11:15 AM  Follow-up for Phone Call        called pt, pt request dr's note for today. explained to pt that we are unable to write work excuse without being seen. pt agreed.  Follow-up by: Arlyss Repress CMA,,  August 25, 2006 11:45 AM

## 2010-07-09 NOTE — Letter (Signed)
Summary: Generic Letter  [facility]  [address]   [place], [place] [address]   Phone: [phone]  Fax: [phone]    08/20/2008  Elizabeth Ponce [address] [place], [place]  [address]  Dear [name],   As you expected, the results of the test we did for chlamydia and gonorrhea were negative.  Since all the tests we did have been negative, then I think the discharge you are experiencing is simply your body returning to normal after the yeast infection and the NuvaRing, and it should go away on its own. If it continues to bother you, please feel free to call us.   Sincerely,   Joaovictor Krone [place] MD [facility]  Appended Document: Generic Letter mailed

## 2010-07-09 NOTE — Progress Notes (Signed)
Summary: WI request  Phone Note Call from Patient Call back at [phone]   Reason for Call: Talk to Nurse Summary of Call: pt states she has a fever and cough - has had it since last monday Initial call taken by: Haydee Salter,  November 30, 2006 8:42 AM  Follow-up for Phone Call        reports fever and cough for 1 week. temp during day running 100.8 and at night 102. appointment scheduled today. Follow-up by: Theresia Lo RN,  November 30, 2006 8:51 AM

## 2010-07-09 NOTE — Progress Notes (Signed)
Summary: Triage  Phone Note Call from Patient Call back at Work Phone [phone]   Reason for Call: Talk to Nurse Summary of Call: pt has some questions re: her medication azithromycin, she is having stomach pain Initial call taken by: ERIN LEVAN,  November 30, 2006 4:27 PM  Follow-up for Phone Call        took 1st dose on empty stomach & c/o pain. to change dosing time to pm with meal . call back if pain reoccurs Follow-up by: Golden Circle RN,  November 30, 2006 4:29 PM

## 2010-07-09 NOTE — Progress Notes (Signed)
Summary: refill  Phone Note Call from Patient Call back at Work Phone [phone]   Caller: Patient Summary of Call: refill Diflucan for yeast inf from an antibiotic she was taken from her derm. [facility], [place] she has no ins. so it needs to be cheap. Initial call taken by: De Nurse,  June 23, 2008 8:41 AM  Follow-up for Phone Call        Will forward to MD Follow-up by: ASHA BENTON LPN,  June 23, 2008 9:44 AM  Additional Follow-up for Phone Call Additional follow up Details #1::        Dear Cliffton Asters Team  fluconazole 100 mg, as below has been sent plz let her know Thanks!  SARA NEAL MD  June 23, 2008 10:50 AM     Additional Follow-up for Phone Call Additional follow up Details #2::    Lm on vm advising fluconazole 100 mg has been sent to pharmacy- call with questions or concerns. Follow-up by: Dedra Skeens CMA,,  June 23, 2008 11:08 AM  New/Updated Medications: DIFLUCAN 100 MG TABS (FLUCONAZOLE) 1 by mouth every other day for 3 doses   Prescriptions: DIFLUCAN 100 MG TABS (FLUCONAZOLE) 1 by mouth every other day for 3 doses  #3 x 2   Entered and Authorized by:   Denny Levy MD   Signed by:   Denny Levy MD on 06/23/2008   Method used:   Electronically to        [facility] [address] (retail)       [phone]       [place], [place]  [address]       Ph: [phone]       Fax: [phone]   RxID:   [id number]

## 2010-07-09 NOTE — Progress Notes (Signed)
Summary: Dr's note  Phone Note Call from Patient Call back at [phone]   Summary of Call: pt is requesting a dr's note from last visit Initial call taken by: Haydee Salter,  December 08, 2006 9:49 AM  Follow-up for Phone Call        note for 6/23 written and in mail per request Follow-up by: Golden Circle RN,  December 08, 2006 10:52 AM

## 2010-07-09 NOTE — Progress Notes (Signed)
  Phone Note Outgoing Call   Summary of Call: DEAR WHITE TEAM  I got a request for diflucan (fluconazole) which she has had before by my records and filled that request. Now I get a fax from her pharmacy [id number]-who say she is allergic to imidazoles.  can you call either the patient or the pharmacy and clarify this? Thanks!  Huntley Dec NEAL MD  June 27, 2008 8:58 AM   Follow-up for Phone Call        message left to retun call. Follow-up by: Theresia Lo RN,  June 27, 2008 9:24 AM  Additional Follow-up for Phone Call Additional follow up Details #1::        spoke with patient and she states she has been allergic to Metronidazole with reaction of itching around ankles. this may be where pharmacy got the information about allergy. however patient states she has used  diflucan in the past without problem. will send message to MD. Additional Follow-up by: Theresia Lo RN,  June 27, 2008 5:01 PM    Additional Follow-up for Phone Call Additional follow up Details #2::    Pharmacy called stating pt allergic to diflucan.  Called pt to verify allergy, left voicemail to return call. Follow-up by: AMY MARTIN RN,  June 28, 2008 3:09 PM  Additional Follow-up for Phone Call Additional follow up Details #3:: Details for Additional Follow-up Action Taken: Spoke with pt- she states she is not allergic to diflucan - has already picked up and taken rx [facility] [address] where she had rx transferred, it is [facility] [address] [facility] is calling.  [facility]ed [facility] [address] and advised that per pt she is not allergic to diflucan.   Additional Follow-up by: AMY MARTIN RN,  June 28, 2008 3:20 PM

## 2010-07-09 NOTE — Assessment & Plan Note (Signed)
Summary: charge only

## 2010-07-09 NOTE — Progress Notes (Signed)
Summary: Triage  Phone Note Call from Patient Call back at Home Phone [phone]   Caller: Patient Call For: Dr. Jarold Motto Reason for Call: Talk to Nurse Summary of Call: pt has some questions about her disease Initial call taken by: Karna Christmas,  Oct 08, 2009 4:35 PM  Follow-up for Phone Call        Pt has questions re celiac dx.  She is asking if Dr. Jarold Motto thinks she has this.  She has been on a gluten free diet off and on since OCtober.  Pt asks if celiac can cause any skin problems.  She noticed that when she was on the gluten free foods her face would not break out as bad.  When she went off the diet would have alot of skin issues with her face.  Asking if these are connected. Follow-up by: Ashok Cordia RN,  Oct 09, 2009 4:47 PM  Additional Follow-up for Phone Call Additional follow up Details #1::        ov needed Additional Follow-up by: Mardella Layman MD [facility],  Oct 10, 2009 8:18 AM    Additional Follow-up for Phone Call Additional follow up Details #2::    H 9:01 AM  Pt informed that she needs OV.  Pt states she does not have any insurance and would perfer not to come in unless absolutely necessary. Follow-up by: Ashok Cordia RN,  Oct 10, 2009 9:19 AM  Additional Follow-up for Phone Call Additional follow up Details #3:: Details for Additional Follow-up Action Taken: NOTED Additional Follow-up by: Mardella Layman MD [facility],  Oct 10, 2009 11:38 AM   Appended Document: Triage LM for pt to call  Appended Document: Triage Pt would need OV to discuss treatment of skin condition per DRP.  Pt notified.

## 2010-07-09 NOTE — Assessment & Plan Note (Signed)
Summary: Depo/ls  Nurse Visit       Medication Administration  Injection # 1:    Medication: Depo-Provera 150mg     Diagnosis: V25.09    Route: IM    Site: L deltoid    Exp Date: 06/16/2008    Lot #: [id number]    Mfr: SICOR    Patient tolerated injection without complications    Given by: Gaylyn Lambert [facility]

## 2010-07-09 NOTE — Progress Notes (Signed)
Summary: Triage  Phone Note Call from Patient Call back at Home Phone [phone]   Caller: Patient Call For: Dr. Jarold Motto Summary of Call: Pt. has some questions about certain foods that she can eat since she is lactose intolerance Initial call taken by: Karna Christmas,  July 02, 2009 3:27 PM  Follow-up for Phone Call        Pt has questions re gluten feee diet.  DIscusssed with pt. Follow-up by: Ashok Cordia RN,  July 02, 2009 3:48 PM

## 2010-08-28 LAB — BASIC METABOLIC PANEL
BUN: 11 mg/dL (ref 6–23)
CO2: 28 mEq/L (ref 19–32)
Calcium: 9.5 mg/dL (ref 8.4–10.5)
Chloride: 103 mEq/L (ref 96–112)
Creatinine, Ser: 0.8 mg/dL (ref 0.4–1.2)
GFR calc Af Amer: >60 mL/min (ref >60)
GFR calc non Af Amer: >60 mL/min (ref >60)
Glucose, Bld: 86 mg/dL (ref 70–99)
Potassium: 3.7 mEq/L (ref 3.5–5.1)
Sodium: 136 mEq/L (ref 135–145)

## 2010-08-28 LAB — DIFFERENTIAL
Basophils Absolute: 0 10*3/uL (ref 0.0–0.1)
Basophils Relative: 1 % (ref 0–1)
Eosinophils Absolute: 0.1 10*3/uL (ref 0.0–0.7)
Eosinophils Relative: 1 % (ref 0–5)
Lymphocytes Relative: 32 % (ref 12–46)
Lymphs Abs: 2.8 10*3/uL (ref 0.7–4.0)
Monocytes Absolute: 0.6 10*3/uL (ref 0.1–1.0)
Monocytes Relative: 6 % (ref 3–12)
Neutro Abs: 5.3 10*3/uL (ref 1.7–7.7)
Neutrophils Relative %: 60 % (ref 43–77)

## 2010-08-28 LAB — URINALYSIS, ROUTINE W REFLEX MICROSCOPIC
Bilirubin Urine: NEGATIVE
Glucose, UA: NEGATIVE mg/dL
Hgb urine dipstick: NEGATIVE
Ketones, ur: 15 mg/dL — AB
Nitrite: NEGATIVE
Protein, ur: NEGATIVE mg/dL
Specific Gravity, Urine: 1.04 — ABNORMAL HIGH (ref 1.005–1.030)
Urobilinogen, UA: 1 mg/dL (ref 0.0–1.0)
pH: 5.5 (ref 5.0–8.0)

## 2010-08-28 LAB — CBC
HCT: 36.1 % (ref 36.0–46.0)
Hemoglobin: 12.5 g/dL (ref 12.0–15.0)
MCHC: 34.7 g/dL (ref 30.0–36.0)
MCV: 97.7 fL (ref 78.0–100.0)
Platelets: 272 10*3/uL (ref 150–400)
RBC: 3.69 MIL/uL — ABNORMAL LOW (ref 3.87–5.11)
RDW: 13.1 % (ref 11.5–15.5)
WBC: 8.9 10*3/uL (ref 4.0–10.5)

## 2010-08-28 LAB — POCT PREGNANCY, URINE: Preg Test, Ur: NEGATIVE

## 2010-09-21 ENCOUNTER — Other Ambulatory Visit: Payer: Self-pay | Admitting: Family Medicine

## 2010-12-23 ENCOUNTER — Other Ambulatory Visit: Payer: Self-pay | Admitting: Gynecology

## 2011-01-10 IMAGING — CT CT ABD-PELV W/O CM
1 of 4 series · 15 of 36 positions shown, 19 images · non-contrast
Comparison: Renal ultrasound 07/13/2009

CLINICAL DATA: Abdominal pain.

CT ABDOMEN AND PELVIS WITHOUT CONTRAST
TECHNIQUE: Multidetector CT imaging of the abdomen and pelvis was
performed following the standard protocol without intravenous
contrast.

[Series 2: stone <(id) >(id) · axial · 0.57mm/px · z∈[-437,-77]mm · 15 of 76 slices shown, 19 images]
[im 4/76  soft-tissue]
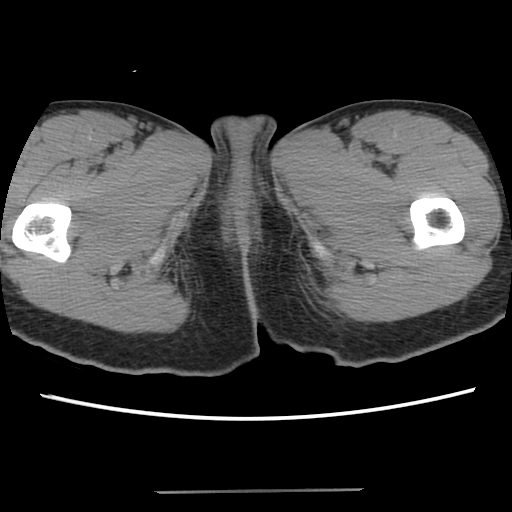
[im 4/76  bone]
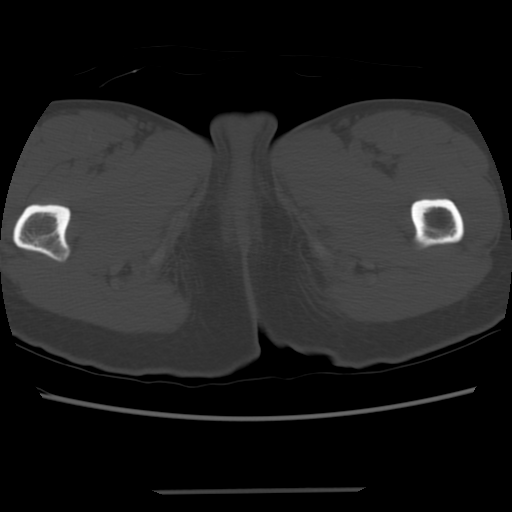
[im 10/76  soft-tissue]
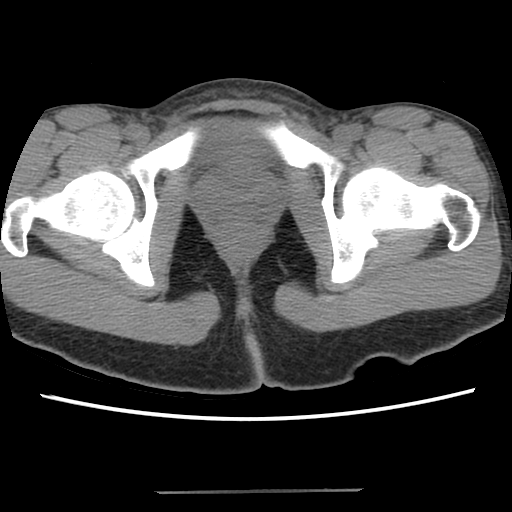
[im 16/76  soft-tissue]
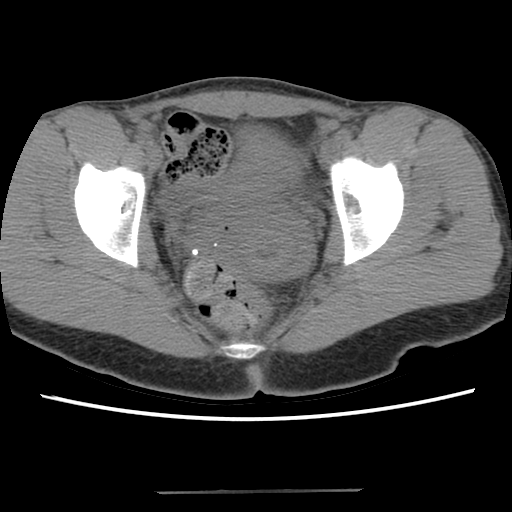
[im 22/76  soft-tissue]
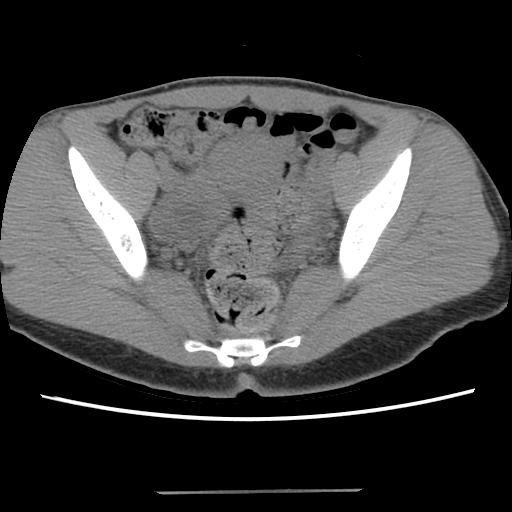
[im 26/76  soft-tissue]
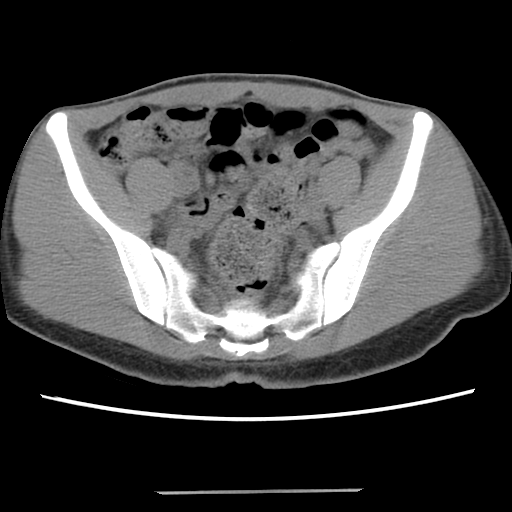
[im 32/76  soft-tissue]
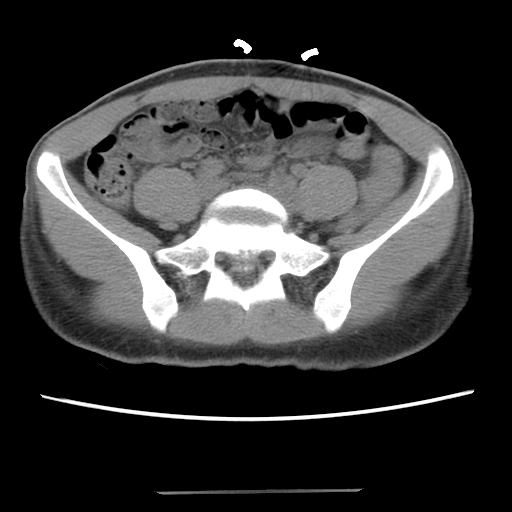
[im 38/76  soft-tissue]
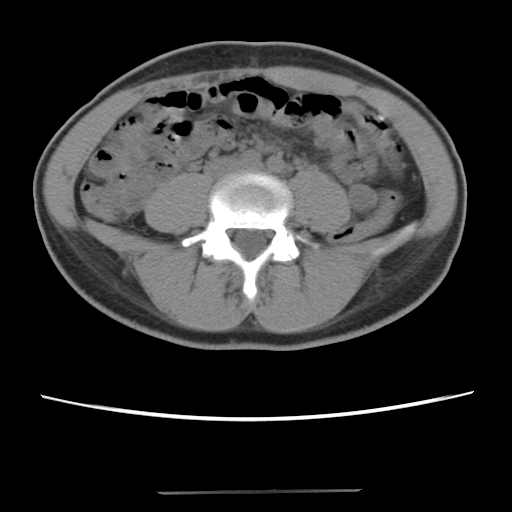
[im 44/76  soft-tissue]
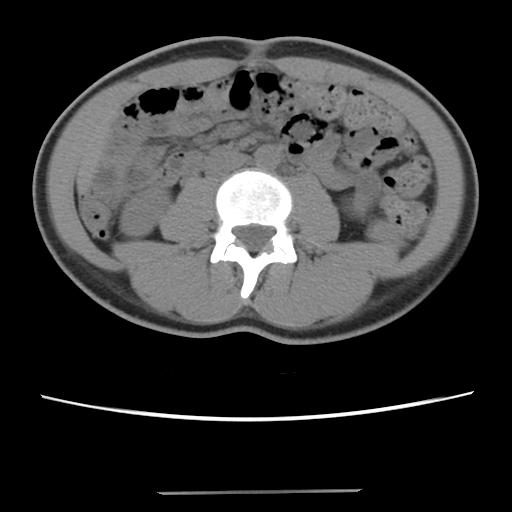
[im 51/76  soft-tissue]
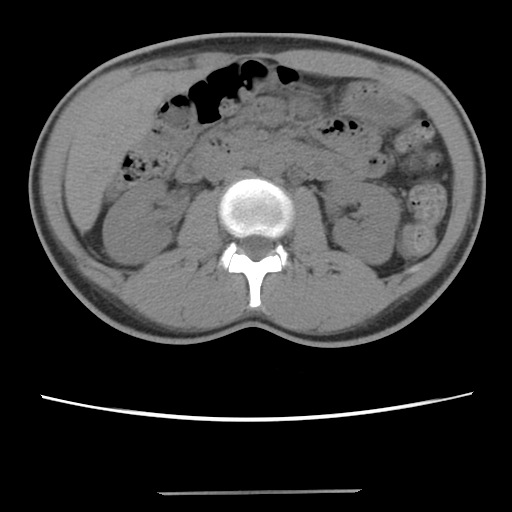
[im 51/76  bone]
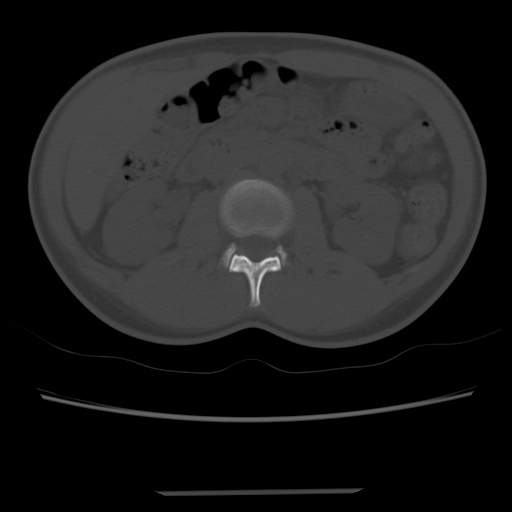
[im 54/76  soft-tissue]
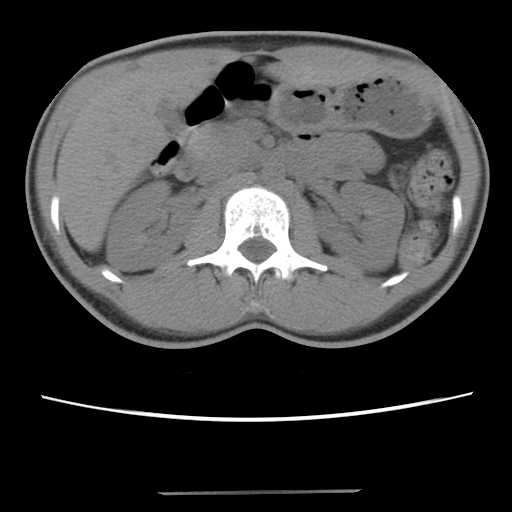
[im 60/76  soft-tissue]
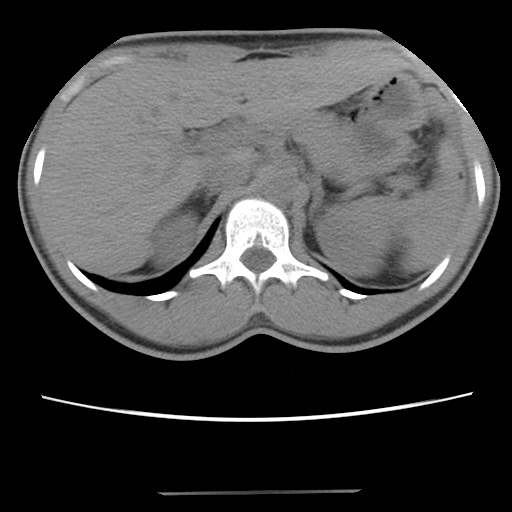
[im 63/76  lung]
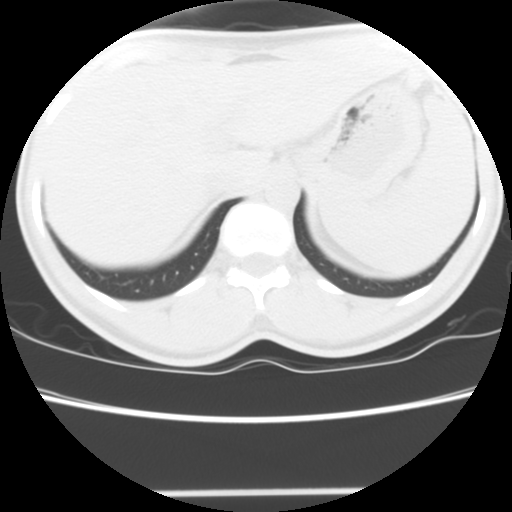
[im 66/76  soft-tissue]
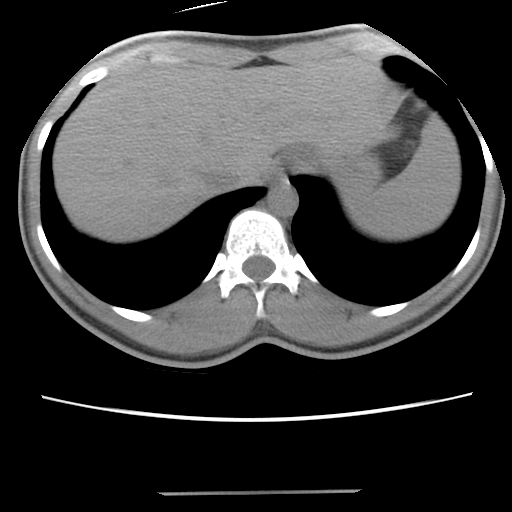
[im 66/76  lung]
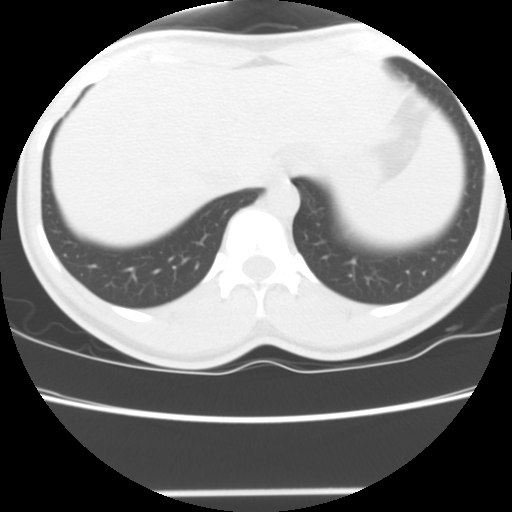
[im 69/76  lung]
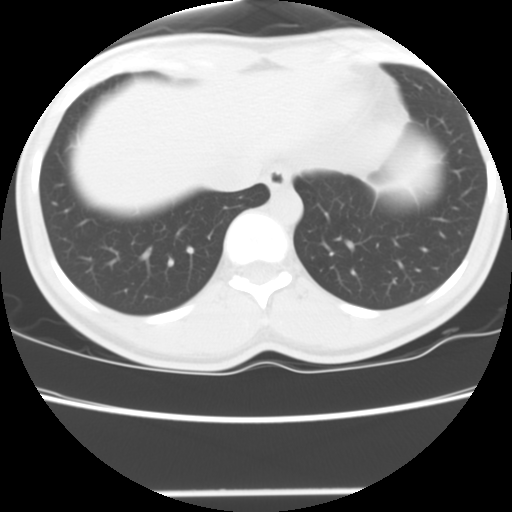
[im 72/76  soft-tissue]
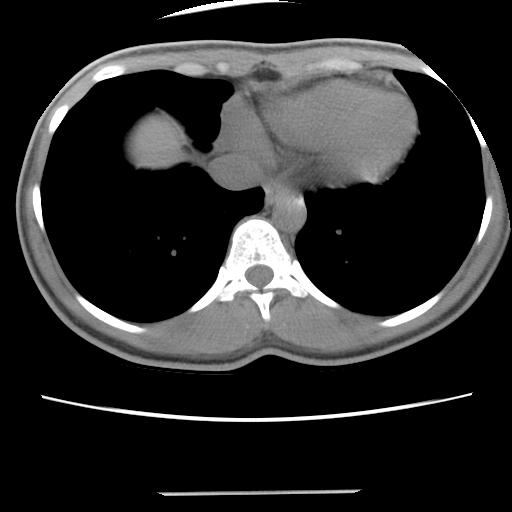
[im 72/76  lung]
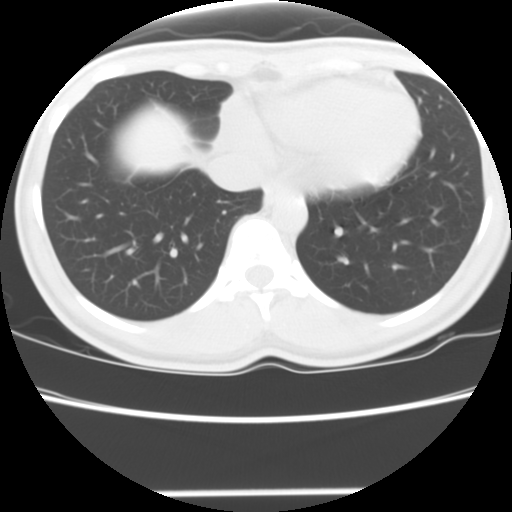

[15 of 36 positions shown; findings below may reference images not displayed]

FINDINGS: Lung bases are clear.  No effusions.  Heart is normal
size.

Abdomen:  No renal or proximal ureteral stones.  No hydronephrosis.
Solid organs have an unremarkable unenhanced appearance.
Gallbladder is contracted.  Bowel grossly unremarkable.  No free
fluid, free air or adenopathy.  Aorta is normal caliber.

Pelvis:  Appendix is visualized and is normal.  Ureters are
decompressed and difficult to follow.  No evidence of ureteral
stone.  Small phleboliths in the pelvis.

Approximately 2.5 cm right ovarian cyst present.  Uterus and left
adnexa have an unremarkable unenhanced appearance.  No free fluid,
free air or adenopathy.
IMPRESSION: No evidence of renal or ureteral stones.

Normal appendix.

2.5 cm right ovarian cyst.

## 2012-05-27 DIAGNOSIS — L708 Other acne: Secondary | ICD-10-CM | POA: Insufficient documentation

## 2012-07-15 DIAGNOSIS — L989 Disorder of the skin and subcutaneous tissue, unspecified: Secondary | ICD-10-CM | POA: Insufficient documentation

## 2012-10-07 ENCOUNTER — Other Ambulatory Visit (HOSPITAL_COMMUNITY)
Admission: RE | Admit: 2012-10-07 | Discharge: 2012-10-07 | Disposition: A | Discharge status: Home / Self Care | Payer: 59 | Source: Ambulatory Visit | Attending: Internal Medicine | Admitting: Internal Medicine

## 2012-10-07 ENCOUNTER — Other Ambulatory Visit: Payer: Self-pay | Admitting: Internal Medicine

## 2012-10-07 DIAGNOSIS — Z01419 Encounter for gynecological examination (general) (routine) without abnormal findings: Secondary | ICD-10-CM | POA: Insufficient documentation

## 2013-04-28 DIAGNOSIS — L259 Unspecified contact dermatitis, unspecified cause: Secondary | ICD-10-CM | POA: Insufficient documentation

## 2013-07-19 ENCOUNTER — Encounter (INDEPENDENT_AMBULATORY_CARE_PROVIDER_SITE_OTHER): Payer: Self-pay

## 2013-07-19 ENCOUNTER — Ambulatory Visit (INDEPENDENT_AMBULATORY_CARE_PROVIDER_SITE_OTHER): Payer: 59 | Admitting: Interventional Cardiology

## 2013-07-19 ENCOUNTER — Encounter: Payer: Self-pay | Admitting: Cardiology

## 2013-07-19 ENCOUNTER — Encounter: Payer: Self-pay | Admitting: Interventional Cardiology

## 2013-07-19 VITALS — BP 120/82 | HR 65 | Ht 63.0 in | Wt 115.8 lb

## 2013-07-19 DIAGNOSIS — I10 Essential (primary) hypertension: Secondary | ICD-10-CM

## 2013-07-19 DIAGNOSIS — R5381 Other malaise: Secondary | ICD-10-CM

## 2013-07-19 DIAGNOSIS — R5383 Other fatigue: Secondary | ICD-10-CM

## 2013-07-19 DIAGNOSIS — R079 Chest pain, unspecified: Secondary | ICD-10-CM

## 2013-07-19 LAB — CBC
HCT: 37.9 % (ref 36.0–46.0)
Hemoglobin: 12.8 g/dL (ref 12.0–15.0)
MCHC: 33.7 g/dL (ref 30.0–36.0)
MCV: 98.2 fl (ref 78.0–100.0)
Platelets: 343 10*3/uL (ref 150.0–400.0)
RBC: 3.86 Mil/uL — ABNORMAL LOW (ref 3.87–5.11)
RDW: 13.1 % (ref 11.5–14.6)
WBC: 8.8 10*3/uL (ref 4.5–10.5)

## 2013-07-19 LAB — BASIC METABOLIC PANEL
BUN: 12 mg/dL (ref 6–23)
CO2: 29 mEq/L (ref 19–32)
Calcium: 9.9 mg/dL (ref 8.4–10.5)
Chloride: 101 mEq/L (ref 96–112)
Creatinine, Ser: 0.9 mg/dL (ref 0.4–1.2)
GFR: 101.02 mL/min (ref >60.00)
Glucose, Bld: 84 mg/dL (ref 70–99)
Potassium: 3.3 mEq/L — ABNORMAL LOW (ref 3.5–5.1)
Sodium: 138 mEq/L (ref 135–145)

## 2013-07-19 LAB — TSH: TSH: 1.36 u[IU]/mL (ref 0.35–5.50)

## 2013-07-19 MED ORDER — IBUPROFEN 200 MG PO TABS: 400.0000 mg | ORAL_TABLET | Freq: Three times a day (TID) | ORAL | Status: DC

## 2013-07-19 NOTE — Progress Notes (Signed)
Patient ID: Elizabeth Ponce, female   DOB: Oct 14, 1985, 28 y.o.   MRN: [id number]    [place], [place], [place]  [address] Phone: [phone] Fax:  [phone]  Date:  07/19/2013   ID:  Elizabeth Ponce, DOB 02/26/1986, MRN [id number]  PCP:  No primary provider on file.      History of Present Illness: Elizabeth Ponce is a 28 y.o. female  whose mother had heart disease at a young age, in her late 31's. The patient has HTN and over the past few weeks has had chest pain.  Episodes last than a second.  They are not associated with exertion.  She feels that her heart rate stays high post exercise for a longer time than usual. No Cp with exercise, even at the gym on the treadmill.  She can push on the spot of the pain and this can make the pain worse.   In the past, sharp pain has happened at work and resolves when she calms down. No problems walking up stairs. No chest pain with that.  Patient diagnosed with HTN at age 36. She has been on medicines since that time. She was temporarily on HCTZ with the metoprolol, but the HCTZ was stopped and the metoprolol was reduced due to low BP.  HCTZ was restarted.         Wt Readings from Last 3 Encounters:  07/19/13 115 lb 12.8 oz (52.527 kg)  05/11/09 117 lb 4 oz (53.184 kg)  03/30/09 116 lb 4 oz (52.731 kg)     Past Medical History  Diagnosis Date  . Hypertension   . Kidney stones     Current Outpatient Prescriptions  Medication Sig Dispense Refill  . acyclovir (ZOVIRAX) 800 MG tablet Take 400 mg by mouth.       . hydrochlorothiazide (HYDRODIURIL) 25 MG tablet Take 12.5 mg by mouth daily.      Marland Kitchen levonorgestrel (MIRENA) 20 MCG/24HR IUD 1 each by Intrauterine route once.      Marland Kitchen LUPRON DEPOT 11.25 MG injection       . metoprolol tartrate (LOPRESSOR) 25 MG tablet Take 12.5 mg by mouth daily.       . Multiple Vitamins-Minerals (MULTIVITAMIN PO) Take by mouth.      . pyridOXINE (VITAMIN B-6) 100 MG tablet Take 100 mg by  mouth daily.       No current facility-administered medications for this visit.    Allergies:    Allergies  Allergen Reactions  . Elidel [Pimecrolimus]   . Metronidazole     REACTION: itching    Social History:  The patient  reports that she has never smoked. She does not have any smokeless tobacco history on file. She reports that she drinks alcohol. She reports that she does not use illicit drugs.   Family History:  The patient's family history includes Heart disease (age of onset: 63) in her father; Hypertension in her father.   ROS:  Please see the history of present illness.  No nausea, vomiting.  No fevers, chills.  No focal weakness.  No dysuria.   All other systems reviewed and negative.   PHYSICAL EXAM: VS:  BP 120/82  Pulse 65  Ht 5\' 3"  (1.6 m)  Wt 115 lb 12.8 oz (52.527 kg)  BMI 20.52 kg/m2 Well nourished, well developed, in no acute distress HEENT: normal Neck: no JVD, no carotid bruits Cardiac:  normal S1, S2; RRR; no pain to palpation of the parasternal area  Lungs:  clear to auscultation bilaterally, no wheezing, rhonchi or rales Abd: soft, nontender, no hepatomegaly Ext: no edema Skin: warm and dry Neuro:   no focal abnormalities noted  EKG: NSR, mild anterior T wave inversion, no significant change from prior     ASSESSMENT AND PLAN:  1. Chest pain: Atypical.  Does not sound cardiac.  May have costochondritis as her pain is typically sharp at the parasternal area.  ETT from 2012 showed no ischemia.   2. Headache: Not rleated to HTN.   3. HTN: BP controlled. COntinue current meds. 4. Fatigue: Persistent.  Check labs at her request.  It has been since 2013 snce some of these labs were checked.  Signed, Mina Marble, MD, [facility] 07/19/2013 11:19 AM

## 2013-07-19 NOTE — Patient Instructions (Signed)
Your physician recommends that you return for lab work today for BMET, CBC, and TSH.  Your physician has recommended you make the following change in your medication:   1. Take OTC Ibuprofen 400mg  three times a day as needed for a week.  Your physician recommends that you schedule a follow-up appointment as needed.

## 2013-07-20 ENCOUNTER — Telehealth: Payer: Self-pay | Admitting: Interventional Cardiology

## 2013-07-20 ENCOUNTER — Telehealth: Payer: Self-pay | Admitting: Cardiology

## 2013-07-20 DIAGNOSIS — Z79899 Other long term (current) drug therapy: Secondary | ICD-10-CM

## 2013-07-20 MED ORDER — POTASSIUM CHLORIDE CRYS ER 20 MEQ PO TBCR: 20.0000 meq | EXTENDED_RELEASE_TABLET | Freq: Every day | ORAL | Status: DC

## 2013-07-20 NOTE — Telephone Encounter (Signed)
New message     Pt has low potassium and needs a presc.  Why did the doctor order a presc instead of having her take vitamins?

## 2013-07-20 NOTE — Telephone Encounter (Signed)
Spoke with pt and let her know that she would need a more potent potassium source.

## 2013-07-20 NOTE — Telephone Encounter (Signed)
Message copied by Alcario Drought on Wed Jul 20, 2013 12:20 PM ------      Message from: Jettie Booze      Created: Tue Jul 19, 2013  5:35 PM       Normal hemoglobin. Potassium is slightly low. Start KCl 20 mEq daily. BMet in one week. ------

## 2013-07-20 NOTE — Telephone Encounter (Signed)
Pt notified. Meds updated and rx sent in. Bmet ordered.

## 2013-07-27 ENCOUNTER — Other Ambulatory Visit (INDEPENDENT_AMBULATORY_CARE_PROVIDER_SITE_OTHER): Payer: 59

## 2013-07-27 DIAGNOSIS — Z79899 Other long term (current) drug therapy: Secondary | ICD-10-CM

## 2013-07-27 LAB — BASIC METABOLIC PANEL
BUN: 11 mg/dL (ref 6–23)
CO2: 28 mEq/L (ref 19–32)
Calcium: 9.7 mg/dL (ref 8.4–10.5)
Chloride: 101 mEq/L (ref 96–112)
Creatinine, Ser: 0.9 mg/dL (ref 0.4–1.2)
GFR: 99.65 mL/min (ref >60.00)
Glucose, Bld: 82 mg/dL (ref 70–99)
Potassium: 3.2 mEq/L — ABNORMAL LOW (ref 3.5–5.1)
Sodium: 136 mEq/L (ref 135–145)

## 2013-07-28 ENCOUNTER — Telehealth: Payer: Self-pay | Admitting: Cardiology

## 2013-07-28 DIAGNOSIS — E876 Hypokalemia: Secondary | ICD-10-CM

## 2013-07-28 MED ORDER — POTASSIUM CHLORIDE CRYS ER 20 MEQ PO TBCR: 40.0000 meq | EXTENDED_RELEASE_TABLET | Freq: Every day | ORAL | Status: DC

## 2013-07-28 NOTE — Telephone Encounter (Signed)
Message copied by Alcario Drought on Thu Jul 28, 2013  5:02 PM ------      Message from: [place], [place] H      Created: Wed Jul 27, 2013  4:48 PM       Spoke with pt and she has Klor-Con 20 MEQ 1 tab daily. Pt is taking HCTZ 25 mg 1/2 tablet daily and if pt doesn't take this medication her BP will increase and she will have increased edema. ------

## 2013-07-28 NOTE — Telephone Encounter (Signed)
Per Dr. Irish Lack pt should increase potassium to 40 MEQ daily and repeat bmet 08/04/13. Pt aware, meds updated and lab ordered.

## 2013-08-03 ENCOUNTER — Telehealth: Payer: Self-pay | Admitting: Cardiology

## 2013-08-03 DIAGNOSIS — E876 Hypokalemia: Secondary | ICD-10-CM

## 2013-08-03 DIAGNOSIS — I10 Essential (primary) hypertension: Secondary | ICD-10-CM

## 2013-08-03 NOTE — Telephone Encounter (Signed)
Message copied by Alcario Drought on Wed Aug 03, 2013  9:16 AM ------      Message from: Jettie Booze      Created: Tue Aug 02, 2013 12:57 PM       Please check plasma renin activity;  aldosterone level...diagnosis hypokalemia, HTN ------

## 2013-08-04 ENCOUNTER — Other Ambulatory Visit: Payer: 59

## 2013-08-05 ENCOUNTER — Telehealth: Payer: Self-pay | Admitting: Cardiology

## 2013-08-05 NOTE — Telephone Encounter (Signed)
Message copied by Alcario Drought on Fri Aug 05, 2013 10:37 AM ------      Message from: Jettie Booze      Created: Tue Aug 02, 2013 12:57 PM       Please check plasma renin activity;  aldosterone level...diagnosis hypokalemia, HTN ------

## 2013-08-08 ENCOUNTER — Telehealth: Payer: Self-pay | Admitting: Interventional Cardiology

## 2013-08-08 NOTE — Telephone Encounter (Signed)
New message  Patient needs RF on potassium chloride. She was instructed to take 2 per day and the pharmacy says the RF is to early.  [employer] [phone]. Please call and advise.

## 2013-08-09 ENCOUNTER — Other Ambulatory Visit: Payer: Self-pay

## 2013-08-09 ENCOUNTER — Other Ambulatory Visit: Payer: Self-pay | Admitting: Cardiology

## 2013-08-09 MED ORDER — POTASSIUM CHLORIDE CRYS ER 20 MEQ PO TBCR: 40.0000 meq | EXTENDED_RELEASE_TABLET | Freq: Every day | ORAL | Status: DC

## 2013-08-09 NOTE — Telephone Encounter (Signed)
Follow up     Patient calling stating she has not heard anything from nurse on yesterday message. Would like a call back

## 2013-08-09 NOTE — Telephone Encounter (Signed)
Refilled potassium. Pt aware.

## 2013-08-10 ENCOUNTER — Other Ambulatory Visit (INDEPENDENT_AMBULATORY_CARE_PROVIDER_SITE_OTHER): Payer: 59

## 2013-08-10 DIAGNOSIS — I10 Essential (primary) hypertension: Secondary | ICD-10-CM

## 2013-08-10 DIAGNOSIS — E876 Hypokalemia: Secondary | ICD-10-CM

## 2013-08-10 LAB — BASIC METABOLIC PANEL
BUN: 13 mg/dL (ref 6–23)
CO2: 29 mEq/L (ref 19–32)
Calcium: 9.2 mg/dL (ref 8.4–10.5)
Chloride: 104 mEq/L (ref 96–112)
Creatinine, Ser: 0.8 mg/dL (ref 0.4–1.2)
GFR: 111.51 mL/min (ref >60.00)
Glucose, Bld: 83 mg/dL (ref 70–99)
Potassium: 3.8 mEq/L (ref 3.5–5.1)
Sodium: 137 mEq/L (ref 135–145)

## 2013-08-15 ENCOUNTER — Telehealth: Payer: Self-pay | Admitting: Interventional Cardiology

## 2013-08-15 LAB — ALDOSTERONE + RENIN ACTIVITY W/ RATIO
ALDO / PRA Ratio: 14.6 ratio (ref 0.9–28.9)
Aldosterone: 14 ng/dL
PRA LC/MS/MS: 0.96 ng/mL/h (ref 0.25–5.82)

## 2013-08-15 NOTE — Telephone Encounter (Signed)
New message  ° ° °Patient calling for test results.   °

## 2013-08-15 NOTE — Telephone Encounter (Signed)
Call pt back

## 2013-09-24 ENCOUNTER — Encounter: Payer: Self-pay | Admitting: *Deleted

## 2013-12-20 ENCOUNTER — Other Ambulatory Visit (HOSPITAL_COMMUNITY)
Admission: RE | Admit: 2013-12-20 | Discharge: 2013-12-20 | Disposition: A | Discharge status: Home / Self Care | Payer: 59 | Source: Ambulatory Visit | Attending: Nurse Practitioner | Admitting: Nurse Practitioner

## 2013-12-20 ENCOUNTER — Other Ambulatory Visit: Payer: Self-pay | Admitting: Nurse Practitioner

## 2013-12-20 DIAGNOSIS — Z1151 Encounter for screening for human papillomavirus (HPV): Secondary | ICD-10-CM | POA: Insufficient documentation

## 2013-12-20 DIAGNOSIS — Z01419 Encounter for gynecological examination (general) (routine) without abnormal findings: Secondary | ICD-10-CM | POA: Insufficient documentation

## 2013-12-20 DIAGNOSIS — R87810 Cervical high risk human papillomavirus (HPV) DNA test positive: Secondary | ICD-10-CM | POA: Insufficient documentation

## 2013-12-20 DIAGNOSIS — Z113 Encounter for screening for infections with a predominantly sexual mode of transmission: Secondary | ICD-10-CM | POA: Insufficient documentation

## 2013-12-22 LAB — CYTOLOGY - PAP

## 2015-01-12 DIAGNOSIS — R87620 Atypical squamous cells of undetermined significance on cytologic smear of vagina (ASC-US): Secondary | ICD-10-CM | POA: Insufficient documentation

## 2015-10-12 DIAGNOSIS — R0789 Other chest pain: Secondary | ICD-10-CM | POA: Insufficient documentation

## 2015-10-12 DIAGNOSIS — Z8249 Family history of ischemic heart disease and other diseases of the circulatory system: Secondary | ICD-10-CM | POA: Insufficient documentation

## 2015-10-12 DIAGNOSIS — I498 Other specified cardiac arrhythmias: Secondary | ICD-10-CM | POA: Insufficient documentation

## 2015-11-06 DIAGNOSIS — N870 Mild cervical dysplasia: Secondary | ICD-10-CM | POA: Insufficient documentation

## 2016-08-19 ENCOUNTER — Other Ambulatory Visit: Payer: Self-pay | Admitting: Internal Medicine

## 2016-08-19 ENCOUNTER — Other Ambulatory Visit (HOSPITAL_COMMUNITY): Payer: Self-pay | Admitting: Internal Medicine

## 2016-08-19 ENCOUNTER — Ambulatory Visit (HOSPITAL_COMMUNITY)
Admission: RE | Admit: 2016-08-19 | Discharge: 2016-08-19 | Disposition: A | Discharge status: Home / Self Care | Payer: No Typology Code available for payment source | Source: Ambulatory Visit | Attending: Internal Medicine | Admitting: Internal Medicine

## 2016-08-19 DIAGNOSIS — R1031 Right lower quadrant pain: Secondary | ICD-10-CM | POA: Diagnosis not present

## 2016-08-19 MED ORDER — IOPAMIDOL (ISOVUE-300) INJECTION 61%
INTRAVENOUS | Status: AC
  Administered 2016-08-19: 100 mL
  Filled 2016-08-19: qty 100

## 2016-09-18 ENCOUNTER — Other Ambulatory Visit: Payer: Self-pay | Admitting: Nurse Practitioner

## 2016-09-18 ENCOUNTER — Other Ambulatory Visit (HOSPITAL_COMMUNITY)
Admission: RE | Admit: 2016-09-18 | Discharge: 2016-09-18 | Disposition: A | Discharge status: Home / Self Care | Payer: No Typology Code available for payment source | Source: Ambulatory Visit | Attending: Nurse Practitioner | Admitting: Nurse Practitioner

## 2016-09-18 DIAGNOSIS — Z113 Encounter for screening for infections with a predominantly sexual mode of transmission: Secondary | ICD-10-CM | POA: Diagnosis present

## 2016-09-18 DIAGNOSIS — Z01419 Encounter for gynecological examination (general) (routine) without abnormal findings: Secondary | ICD-10-CM | POA: Diagnosis present

## 2016-09-18 DIAGNOSIS — Z1151 Encounter for screening for human papillomavirus (HPV): Secondary | ICD-10-CM | POA: Insufficient documentation

## 2016-09-22 LAB — CYTOLOGY - PAP
Chlamydia: NEGATIVE
Diagnosis: NEGATIVE
HPV: NOT DETECTED
Neisseria Gonorrhea: NEGATIVE

## 2017-04-23 ENCOUNTER — Other Ambulatory Visit: Payer: Self-pay | Admitting: Nurse Practitioner

## 2017-04-23 ENCOUNTER — Other Ambulatory Visit: Payer: Self-pay

## 2017-04-23 DIAGNOSIS — N6001 Solitary cyst of right breast: Secondary | ICD-10-CM

## 2017-04-29 ENCOUNTER — Other Ambulatory Visit: Payer: No Typology Code available for payment source

## 2017-05-08 ENCOUNTER — Other Ambulatory Visit: Payer: No Typology Code available for payment source

## 2017-05-19 ENCOUNTER — Ambulatory Visit
Admission: RE | Admit: 2017-05-19 | Discharge: 2017-05-19 | Disposition: A | Discharge status: Home / Self Care | Payer: No Typology Code available for payment source | Source: Ambulatory Visit | Attending: Nurse Practitioner | Admitting: Nurse Practitioner

## 2017-05-19 DIAGNOSIS — N6001 Solitary cyst of right breast: Secondary | ICD-10-CM

## 2018-08-30 DIAGNOSIS — I10 Essential (primary) hypertension: Secondary | ICD-10-CM | POA: Diagnosis not present

## 2018-11-12 DIAGNOSIS — R0600 Dyspnea, unspecified: Secondary | ICD-10-CM | POA: Diagnosis not present

## 2018-11-17 DIAGNOSIS — R5381 Other malaise: Secondary | ICD-10-CM | POA: Diagnosis not present

## 2018-11-17 DIAGNOSIS — R5383 Other fatigue: Secondary | ICD-10-CM | POA: Diagnosis not present

## 2018-11-17 DIAGNOSIS — R0789 Other chest pain: Secondary | ICD-10-CM | POA: Diagnosis not present

## 2018-11-17 DIAGNOSIS — M545 Low back pain: Secondary | ICD-10-CM | POA: Diagnosis not present

## 2018-12-08 DIAGNOSIS — Z20828 Contact with and (suspected) exposure to other viral communicable diseases: Secondary | ICD-10-CM | POA: Diagnosis not present

## 2018-12-16 DIAGNOSIS — R222 Localized swelling, mass and lump, trunk: Secondary | ICD-10-CM | POA: Diagnosis not present

## 2018-12-16 DIAGNOSIS — R0789 Other chest pain: Secondary | ICD-10-CM | POA: Diagnosis not present

## 2019-04-06 DIAGNOSIS — Z01419 Encounter for gynecological examination (general) (routine) without abnormal findings: Secondary | ICD-10-CM | POA: Diagnosis not present

## 2019-04-06 DIAGNOSIS — Z30432 Encounter for removal of intrauterine contraceptive device: Secondary | ICD-10-CM | POA: Diagnosis not present

## 2019-04-06 DIAGNOSIS — Z3202 Encounter for pregnancy test, result negative: Secondary | ICD-10-CM | POA: Diagnosis not present

## 2019-04-19 ENCOUNTER — Ambulatory Visit: Payer: BLUE CROSS/BLUE SHIELD | Admitting: Sports Medicine

## 2019-04-19 ENCOUNTER — Ambulatory Visit (INDEPENDENT_AMBULATORY_CARE_PROVIDER_SITE_OTHER): Payer: 59

## 2019-04-19 ENCOUNTER — Other Ambulatory Visit: Payer: Self-pay

## 2019-04-19 ENCOUNTER — Encounter: Payer: Self-pay | Admitting: Sports Medicine

## 2019-04-19 DIAGNOSIS — I872 Venous insufficiency (chronic) (peripheral): Secondary | ICD-10-CM | POA: Diagnosis not present

## 2019-04-19 DIAGNOSIS — M79675 Pain in left toe(s): Secondary | ICD-10-CM

## 2019-04-19 DIAGNOSIS — M2142 Flat foot [pes planus] (acquired), left foot: Secondary | ICD-10-CM

## 2019-04-19 DIAGNOSIS — M79671 Pain in right foot: Secondary | ICD-10-CM

## 2019-04-19 DIAGNOSIS — M2141 Flat foot [pes planus] (acquired), right foot: Secondary | ICD-10-CM | POA: Diagnosis not present

## 2019-04-19 DIAGNOSIS — M79672 Pain in left foot: Secondary | ICD-10-CM

## 2019-04-19 NOTE — Progress Notes (Signed)
Subjective: Elizabeth Ponce is a 33 y.o. female patient who presents to office for evaluation of Left>Right foot pain at 5th toe after bumping it on walker 3 months ago. Patient complains of progressive pain especially over the last year in the Right>Left foot that starts as fatigue in the medial arch. Reports pain feels like pressure and notices that her ankles are shaped weird with some swelling R>L with skin discoloration.  Patient has also tried inserts for feet which made feet more painful in the past many years ago. Patient denies any other pedal complaints.   Review of Systems  All other systems reviewed and are negative.    Patient Active Problem List   Diagnosis Date Noted  . CIN I (cervical intraepithelial neoplasia I) 11/06/2015  . Chest discomfort 10/12/2015  . FH: CAD (coronary artery disease) 10/12/2015  . Sinus arrhythmia 10/12/2015  . ASCUS with positive high risk human papillomavirus of vagina 01/12/2015  . Contact dermatitis and other eczema, due to unspecified cause 04/28/2013  . Disorder of skin or subcutaneous tissue 07/15/2012  . Other acne 05/27/2012  . COUGH 05/11/2009  . IRRITABLE BOWEL SYNDROME 03/19/2009  . HEMATOCHEZIA 03/16/2009  . UNSPECIFIED HEMORRHOIDS WITH OTHER COMPLICATION [id number]  . VAGINITIS 08/17/2008  . HAIR LOSS 12/22/2007  . LACTOSE INTOLERANCE 02/04/2007  . HYPERTENSION, BENIGN 09/15/2006  . RENAL STONE 09/15/2006  . HYPERHIDROSIS 09/15/2006   Current Outpatient Medications on File Prior to Visit  Medication Sig Dispense Refill  . acyclovir (ZOVIRAX) 800 MG tablet Take 400 mg by mouth.     . hydrochlorothiazide (HYDRODIURIL) 25 MG tablet Take 12.5 mg by mouth daily.    Marland Kitchen ibuprofen (MOTRIN IB) 200 MG tablet Take 2 tablets (400 mg total) by mouth 3 (three) times daily. 30 tablet 0  . levonorgestrel (MIRENA) 20 MCG/24HR IUD 1 each by Intrauterine route once.    Marland Kitchen LUPRON DEPOT 11.25 MG injection     . meloxicam (MOBIC) 15 MG tablet Take 15  mg by mouth daily.    . metoprolol tartrate (LOPRESSOR) 25 MG tablet Take 12.5 mg by mouth daily.     . Multiple Vitamins-Minerals (MULTIVITAMIN PO) Take by mouth.    . potassium chloride SA (KLOR-CON M20) 20 MEQ tablet Take 2 tablets (40 mEq total) by mouth daily. 60 tablet 6  . pyridOXINE (VITAMIN B-6) 100 MG tablet Take 100 mg by mouth daily.     No current facility-administered medications on file prior to visit.    Allergies  Allergen Reactions  . Elidel [Pimecrolimus]   . Metronidazole     REACTION: itching     Objective:  General: Alert and oriented x3 in no acute distress  Dermatology: No open lesions bilateral lower extremities, no webspace macerations, no ecchymosis bilateral, all nails x 10 are well manicured.  Vascular: Dorsalis Pedis and Posterior Tibial pedal pulses 1/4, Capillary Fill Time 3 seconds, (+) pedal hair growth bilateral, no edema bilateral lower extremities, Temperature gradient within normal limits.  Neurology: Johney Maine sensation intact via light touch bilateral.   Musculoskeletal: Mild tenderness with palpation along medial arch, medial fascial band on Right>Left, mild tenderness along Posterior tibial tendon course with medial soft tissue buldge noted, there is decreased ankle rom with knee extending  vs flexed resembling gastroc equnius bilateral, Subtalar joint range of motion is within normal limits, there is no 1st ray hypermobility noted bilateral, decreased 1st MPJ rom Right>Left with functional limitus noted on weightbearing exam, there is medial arch collapse Right> Left on weightbearing  exam,slight RF valgus Right> Left, no "too-many toes" sign appreciated, unable to perform heel rise test due to pain in right>left arch.   Gait: Antalgic gait with increased medial arch collapse and pronatory influence noted on Right> Left foot with medial 1st MPJ roll off in toe-off.   Xrays Right/Left foot:  Normal osseous mineralization. Joint spaces preserved. No  fracture/dislocation/boney destruction. Mild 1st ray elevatus present. Increased Talar head uncovering present. Anterior break in cyma line with midtarsal breach present. Increased Talar declination present. Decreased calcaneal inclination present.  No fracture at left 5th toe. No soft tissue abnormalities or radiopaque foreign bodies.   Assessment and Plan: Problem List Items Addressed This Visit    None    Visit Diagnoses    Pain in left foot    -  Primary   Relevant Orders   DG Foot Complete Left   Pain in right foot       Relevant Orders   DG Foot Complete Right   Pes planus of both feet       Venous stasis dermatitis of both lower extremities       R>L   Toe pain, left         -Complete examination performed -Xrays reviewed -Discussed treatement options; discussed pes planus deformity, swelling in ankle and venous skin changes and history of stubbing 5th toe -Rx Fascial braces bilateral if offers relief will benefit from CFOs -Advised patient to try Mobic for 1 week of which she has at home for toe and back pain -Dispensed toe cap for left 5th toe -Advised Copper fit compression sleeves for swelling and cortisone cream for any itchiness at  Ankle -Encourage daily stretching and good supportive shoes even when in home -Patient to return to office in 1 month or sooner if condition worsens.  Landis Martins, DPM

## 2019-04-19 NOTE — Patient Instructions (Signed)
For tennis shoes recommend:  Kandy Garrison Ascis New balance [place]con[place] Can be purchased at [employer] or [employer]onson Can be purchased at [employer] or [employer]   For work shoes recommend: [employer]  Can be purchased at a variety of places or Engineer, maintenance (IT)   For casual shoes recommend: Vionic  Can be purchased at [employer] or [employer] for the compression socks for ankle swelling

## 2019-04-20 ENCOUNTER — Other Ambulatory Visit: Payer: Self-pay | Admitting: Sports Medicine

## 2019-04-20 DIAGNOSIS — M2141 Flat foot [pes planus] (acquired), right foot: Secondary | ICD-10-CM

## 2019-04-20 DIAGNOSIS — M2142 Flat foot [pes planus] (acquired), left foot: Secondary | ICD-10-CM

## 2019-04-21 DIAGNOSIS — Z308 Encounter for other contraceptive management: Secondary | ICD-10-CM | POA: Diagnosis not present

## 2019-04-21 DIAGNOSIS — Z3202 Encounter for pregnancy test, result negative: Secondary | ICD-10-CM | POA: Diagnosis not present

## 2019-05-17 ENCOUNTER — Ambulatory Visit: Payer: 59 | Admitting: Sports Medicine

## 2019-06-13 DIAGNOSIS — N920 Excessive and frequent menstruation with regular cycle: Secondary | ICD-10-CM | POA: Diagnosis not present

## 2019-08-01 DIAGNOSIS — Z3046 Encounter for surveillance of implantable subdermal contraceptive: Secondary | ICD-10-CM | POA: Diagnosis not present

## 2019-08-01 DIAGNOSIS — N939 Abnormal uterine and vaginal bleeding, unspecified: Secondary | ICD-10-CM | POA: Diagnosis not present

## 2019-08-24 DIAGNOSIS — R3989 Other symptoms and signs involving the genitourinary system: Secondary | ICD-10-CM | POA: Diagnosis not present

## 2019-08-25 DIAGNOSIS — N920 Excessive and frequent menstruation with regular cycle: Secondary | ICD-10-CM | POA: Diagnosis not present

## 2019-08-25 DIAGNOSIS — R300 Dysuria: Secondary | ICD-10-CM | POA: Diagnosis not present

## 2019-11-08 DIAGNOSIS — N939 Abnormal uterine and vaginal bleeding, unspecified: Secondary | ICD-10-CM | POA: Diagnosis not present

## 2019-11-10 DIAGNOSIS — N920 Excessive and frequent menstruation with regular cycle: Secondary | ICD-10-CM | POA: Diagnosis not present

## 2019-11-11 DIAGNOSIS — J22 Unspecified acute lower respiratory infection: Secondary | ICD-10-CM | POA: Diagnosis not present

## 2019-12-08 DIAGNOSIS — N920 Excessive and frequent menstruation with regular cycle: Secondary | ICD-10-CM | POA: Diagnosis not present

## 2019-12-08 DIAGNOSIS — N898 Other specified noninflammatory disorders of vagina: Secondary | ICD-10-CM | POA: Diagnosis not present

## 2019-12-08 DIAGNOSIS — Z124 Encounter for screening for malignant neoplasm of cervix: Secondary | ICD-10-CM | POA: Diagnosis not present

## 2019-12-09 ENCOUNTER — Ambulatory Visit
Admission: RE | Admit: 2019-12-09 | Discharge: 2019-12-09 | Disposition: A | Discharge status: Home / Self Care | Payer: 59 | Source: Ambulatory Visit | Attending: Internal Medicine | Admitting: Internal Medicine

## 2019-12-09 ENCOUNTER — Other Ambulatory Visit: Payer: Self-pay | Admitting: Internal Medicine

## 2019-12-09 DIAGNOSIS — R1084 Generalized abdominal pain: Secondary | ICD-10-CM | POA: Diagnosis not present

## 2019-12-09 DIAGNOSIS — M542 Cervicalgia: Secondary | ICD-10-CM | POA: Diagnosis not present

## 2019-12-09 DIAGNOSIS — D500 Iron deficiency anemia secondary to blood loss (chronic): Secondary | ICD-10-CM | POA: Diagnosis not present

## 2019-12-09 DIAGNOSIS — M25511 Pain in right shoulder: Secondary | ICD-10-CM | POA: Diagnosis not present

## 2019-12-13 ENCOUNTER — Encounter (HOSPITAL_COMMUNITY): Payer: Self-pay

## 2019-12-13 ENCOUNTER — Other Ambulatory Visit: Payer: Self-pay | Admitting: Obstetrics & Gynecology

## 2019-12-13 ENCOUNTER — Emergency Department (HOSPITAL_COMMUNITY)
Admission: EM | Admit: 2019-12-13 | Discharge: 2019-12-14 | Disposition: A | Discharge status: Home / Self Care | Payer: 59 | Attending: Emergency Medicine | Admitting: Emergency Medicine

## 2019-12-13 DIAGNOSIS — D500 Iron deficiency anemia secondary to blood loss (chronic): Secondary | ICD-10-CM | POA: Diagnosis not present

## 2019-12-13 DIAGNOSIS — I10 Essential (primary) hypertension: Secondary | ICD-10-CM | POA: Insufficient documentation

## 2019-12-13 DIAGNOSIS — R55 Syncope and collapse: Secondary | ICD-10-CM

## 2019-12-13 DIAGNOSIS — Z79899 Other long term (current) drug therapy: Secondary | ICD-10-CM | POA: Insufficient documentation

## 2019-12-13 DIAGNOSIS — R1084 Generalized abdominal pain: Secondary | ICD-10-CM | POA: Diagnosis not present

## 2019-12-13 LAB — BASIC METABOLIC PANEL
Anion gap: 10 (ref 5–15)
BUN: 12 mg/dL (ref 6–20)
CO2: 23 mmol/L (ref 22–32)
Calcium: 9.2 mg/dL (ref 8.9–10.3)
Chloride: 107 mmol/L (ref 98–111)
Creatinine, Ser: 0.87 mg/dL (ref 0.44–1.00)
GFR calc Af Amer: >60 mL/min (ref >60)
GFR calc non Af Amer: >60 mL/min (ref >60)
Glucose, Bld: 91 mg/dL (ref 70–99)
Potassium: 4 mmol/L (ref 3.5–5.1)
Sodium: 140 mmol/L (ref 135–145)

## 2019-12-13 LAB — URINALYSIS, ROUTINE W REFLEX MICROSCOPIC
Bilirubin Urine: NEGATIVE
Glucose, UA: NEGATIVE mg/dL
Ketones, ur: 20 mg/dL — AB
Leukocytes,Ua: NEGATIVE
Nitrite: NEGATIVE
Protein, ur: 30 mg/dL — AB
Specific Gravity, Urine: 1.02 (ref 1.005–1.030)
pH: 5 (ref 5.0–8.0)

## 2019-12-13 LAB — CBC
HCT: 23.2 % — ABNORMAL LOW (ref 36.0–46.0)
Hemoglobin: 7.9 g/dL — ABNORMAL LOW (ref 12.0–15.0)
MCH: 30.2 pg (ref 26.0–34.0)
MCHC: 34.1 g/dL (ref 30.0–36.0)
MCV: 88.5 fL (ref 80.0–100.0)
Platelets: 325 10*3/uL (ref 150–400)
RBC: 2.62 MIL/uL — ABNORMAL LOW (ref 3.87–5.11)
RDW: 16.5 % — ABNORMAL HIGH (ref 11.5–15.5)
WBC: 8.2 10*3/uL (ref 4.0–10.5)
nRBC: 0 % (ref 0.0–0.2)

## 2019-12-13 LAB — I-STAT BETA HCG BLOOD, ED (MC, WL, AP ONLY): I-stat hCG, quantitative: <5 m[IU]/mL (ref <5)

## 2019-12-13 LAB — TYPE AND SCREEN
ABO/RH(D): A POS
Antibody Screen: NEGATIVE

## 2019-12-13 MED ORDER — SODIUM CHLORIDE 0.9% FLUSH
3.0000 mL | Freq: Once | INTRAVENOUS | Status: AC
  Administered 2019-12-14: 3 mL via INTRAVENOUS

## 2019-12-13 MED ORDER — SODIUM CHLORIDE 0.9 % IV BOLUS
1000.0000 mL | Freq: Once | INTRAVENOUS | Status: AC
  Administered 2019-12-14: 1000 mL via INTRAVENOUS

## 2019-12-13 NOTE — ED Triage Notes (Signed)
Pt arrives to ED w/ c/o near syncope episode that happened around 1800 today. Pt states she has a uterine fibroid that has been causing her a lot of bleeding. Pt states she had labwork done on thurs and hgb was 8.2. Pt states she has been feeling fatigued, lightheaded, and weak over the last few days.

## 2019-12-14 NOTE — Discharge Instructions (Addendum)
You were seen today for lightheadedness.  This may be related to your issues of ongoing anemia.  Continue your iron and Oriahnn per your GYN doctor.  Follow-up closely.  If you develop recurrent passage of large volume of blood or clot and subsequently get lightheaded, you may need to be reevaluated for transfusion.  Make sure that you are staying hydrated.

## 2019-12-14 NOTE — ED Notes (Signed)
Discharge instructions discussed with pt. Pt verbalized understanding. Pt stable and ambulatory. No signature pad available. 

## 2019-12-14 NOTE — ED Notes (Signed)
Pt given turkey sandwich and cranberry juice. 

## 2019-12-14 NOTE — ED Notes (Signed)
Pt was independently ambulatory to room from waiting room, steady gait and no difficulty noted.

## 2019-12-14 NOTE — ED Provider Notes (Signed)
[facility] EMERGENCY DEPARTMENT Provider Note   CSN: [id number] Arrival date & time: 12/13/19  2047     History Chief Complaint  Patient presents with  . Near Syncope    Elizabeth Ponce is a 34 y.o. female.  HPI     This is a 34 year old female with a history of hypertension and fibroids who presents with dizziness.  Patient describes sudden onset of lightheadedness while she was cooking dinner.  She did not pass out but felt like she might.  She reports that over the last 7 to 8 months she has had almost daily vaginal bleeding.  She has seen her OB/GYN and this is thought to be secondary to fibroids.  She has been on multiple medications including Megace.  As of this past week she was placed on Oriahnn and iron.  She had not previously been on iron and she initiated this yesterday.  She is also due for an iron transfusion.  Hemoglobin on Thursday of last week was 8.2.  Patient denies any room spinning dizziness.  She did not have any chest pain or headache during this episode of lightheadedness.  She reports that she had a uterine ultrasound several weeks ago.  Patient currently states that she is only having vaginal spotting.  No significant bleeding or passage of clots.  Past Medical History:  Diagnosis Date  . Hypertension   . Kidney stones     Patient Active Problem List   Diagnosis Date Noted  . CIN I (cervical intraepithelial neoplasia I) 11/06/2015  . Chest discomfort 10/12/2015  . FH: CAD (coronary artery disease) 10/12/2015  . Sinus arrhythmia 10/12/2015  . ASCUS with positive high risk human papillomavirus of vagina 01/12/2015  . Contact dermatitis and other eczema, due to unspecified cause 04/28/2013  . Disorder of skin or subcutaneous tissue 07/15/2012  . Other acne 05/27/2012  . COUGH 05/11/2009  . IRRITABLE BOWEL SYNDROME 03/19/2009  . HEMATOCHEZIA 03/16/2009  . UNSPECIFIED HEMORRHOIDS WITH OTHER COMPLICATION 35/36/1443  . VAGINITIS  08/17/2008  . HAIR LOSS 12/22/2007  . LACTOSE INTOLERANCE 02/04/2007  . HYPERTENSION, BENIGN 09/15/2006  . RENAL STONE 09/15/2006  . HYPERHIDROSIS 09/15/2006    Past Surgical History:  Procedure Laterality Date  . BREAST CYST ASPIRATION    . CYSTOSCOPY     FOR KIDNEY STONES     OB History   No obstetric history on file.     Family History  Problem Relation Age of Onset  . Hypertension Father   . Heart disease Father 57       CABG     Social History   Tobacco Use  . Smoking status: Never Smoker  Substance Use Topics  . Alcohol use: Yes  . Drug use: No    Home Medications Prior to Admission medications   Medication Sig Start Date End Date Taking? Authorizing Provider  acyclovir (ZOVIRAX) 800 MG tablet Take 400 mg by mouth.  07/04/13   [provider]  hydrochlorothiazide (HYDRODIURIL) 25 MG tablet Take 12.5 mg by mouth daily.    [provider]  ibuprofen (MOTRIN IB) 200 MG tablet Take 2 tablets (400 mg total) by mouth 3 (three) times daily. 07/19/13   Jettie Booze, MD  levonorgestrel (MIRENA) 20 MCG/24HR IUD 1 each by Intrauterine route once.    [provider]  LUPRON DEPOT 11.25 MG injection  06/28/13   [provider]  meloxicam (MOBIC) 15 MG tablet Take 15 mg by mouth daily. 11/17/18  [provider]  metoprolol tartrate (LOPRESSOR) 25 MG tablet Take 12.5 mg by mouth daily.     [provider]  Multiple Vitamins-Minerals (MULTIVITAMIN PO) Take by mouth.    [provider]  potassium chloride SA (KLOR-CON M20) 20 MEQ tablet Take 2 tablets (40 mEq total) by mouth daily. 08/09/13   Jettie Booze, MD  pyridOXINE (VITAMIN B-6) 100 MG tablet Take 100 mg by mouth daily.    [provider]    Allergies    Elidel [pimecrolimus] and Metronidazole  Review of Systems   Review of Systems  Constitutional: Negative for fever.  Respiratory: Negative for shortness of breath.   Cardiovascular:  Negative for chest pain.  Gastrointestinal: Negative for abdominal pain, nausea and vomiting.  Genitourinary: Positive for vaginal bleeding. Negative for dysuria.  Neurological: Positive for light-headedness. Negative for dizziness and headaches.  All other systems reviewed and are negative.   Physical Exam Updated Vital Signs BP (!) 163/98   Pulse 82   Temp 98.7 F (37.1 C) (Oral)   Resp 17   SpO2 100%   Physical Exam Vitals and nursing note reviewed.  Constitutional:      Appearance: She is well-developed. She is not ill-appearing.  HENT:     Head: Normocephalic and atraumatic.     Mouth/Throat:     Mouth: Mucous membranes are moist.  Eyes:     Pupils: Pupils are equal, round, and reactive to light.  Cardiovascular:     Rate and Rhythm: Normal rate and regular rhythm.     Heart sounds: Normal heart sounds.  Pulmonary:     Effort: Pulmonary effort is normal. No respiratory distress.     Breath sounds: No wheezing.  Abdominal:     General: Bowel sounds are normal.     Palpations: Abdomen is soft.     Tenderness: There is no abdominal tenderness. There is no guarding or rebound.  Musculoskeletal:     Cervical back: Neck supple.  Skin:    General: Skin is warm and dry.  Neurological:     Mental Status: She is alert and oriented to person, place, and time.     Comments: Cranial nerves II through XII intact, 5 out of 5 strength in all 4 extremities, no dysmetria to finger-nose-finger  Psychiatric:        Mood and Affect: Mood normal.     ED Results / Procedures / Treatments   Labs (all labs ordered are listed, but only abnormal results are displayed) Labs Reviewed  CBC - Abnormal; Notable for the following components:      Result Value   RBC 2.62 (*)    Hemoglobin 7.9 (*)    HCT 23.2 (*)    RDW 16.5 (*)    All other components within normal limits  URINALYSIS, ROUTINE W REFLEX MICROSCOPIC - Abnormal; Notable for the following components:   APPearance HAZY (*)      Hgb urine dipstick SMALL (*)    Ketones, ur 20 (*)    Protein, ur 30 (*)    Bacteria, UA RARE (*)    All other components within normal limits  BASIC METABOLIC PANEL  I-STAT BETA HCG BLOOD, ED ([facility], [facility], [facility] ONLY)  TYPE AND SCREEN  ABO/RH    EKG None  Radiology No results found.  Procedures Procedures (including critical care time)  Medications Ordered in ED Medications  sodium chloride flush (NS) 0.9 % injection 3 mL (3 mLs Intravenous Given 12/14/19 0023)  sodium  chloride 0.9 % bolus 1,000 mL (1,000 mLs Intravenous New Bag/Given 12/14/19 0022)    ED Course  I have reviewed the triage vital signs and the nursing notes.  Pertinent labs & imaging results that were available during my care of the patient were reviewed by me and considered in my medical decision making (see chart for details).    MDM Rules/Calculators/A&P                          Patient presents with lightheadedness.  She is overall nontoxic and vital signs are reassuring.  She denies any room spinning dizziness.  Lightheadedness with standing.  She has a history of recent anemia presumed secondary to ongoing vaginal bleeding related to fibroids.  On my evaluation she is nontoxic and neurologic exam is normal.  Suspect lightheadedness may be related to dehydration versus ongoing anemia.  Lower suspicion for arrhythmia.  EKG without evidence of arrhythmia and cardiac monitoring normal.  Patient was not orthostatic and maintain blood pressure with orthostatic testing.  She just started iron and it appears that she is followed closely by her OB/GYN.  Hemoglobin today is 7.9 which is arguably stable from 8.2 on Thursday.  She has no indication for transfusion at this time as she is hemodynamically stable.  Patient was given fluids.  She did have 20 ketones in her urine which was suggestive of some mild dehydration.  Discussed with her that I felt that her symptoms were likely related to mild dehydration and ongoing anemia.   She has no indication for transfusion at this time as she has no history of heart disease.  Recommend continuing iron supplementation and Oriahnn.  Follow-up closely with OB/GYN.  After history, exam, and medical workup I feel the patient has been appropriately medically screened and is safe for discharge home. Pertinent diagnoses were discussed with the patient. Patient was given return precautions.    Final Clinical Impression(s) / ED Diagnoses Final diagnoses:  Near syncope  Iron deficiency anemia due to chronic blood loss    Rx / DC Orders ED Discharge Orders    None       Jaquitta Dupriest, Barbette Hair, MD 12/14/19 [phone]

## 2019-12-20 ENCOUNTER — Encounter (HOSPITAL_COMMUNITY): Payer: 59

## 2019-12-21 ENCOUNTER — Other Ambulatory Visit: Payer: Self-pay

## 2019-12-21 ENCOUNTER — Ambulatory Visit (HOSPITAL_COMMUNITY)
Admission: RE | Admit: 2019-12-21 | Discharge: 2019-12-21 | Disposition: A | Discharge status: Home / Self Care | Payer: 59 | Source: Ambulatory Visit | Attending: Internal Medicine | Admitting: Internal Medicine

## 2019-12-21 DIAGNOSIS — D500 Iron deficiency anemia secondary to blood loss (chronic): Secondary | ICD-10-CM | POA: Diagnosis not present

## 2019-12-21 MED ORDER — SODIUM CHLORIDE 0.9 % IV SOLN
INTRAVENOUS | Status: DC | PRN
  Administered 2019-12-21: 250 mL via INTRAVENOUS

## 2019-12-21 MED ORDER — SODIUM CHLORIDE 0.9 % IV SOLN
750.0000 mg | Freq: Once | INTRAVENOUS | Status: AC
  Administered 2019-12-21: 750 mg via INTRAVENOUS
  Filled 2019-12-21: qty 15

## 2019-12-21 NOTE — Discharge Instructions (Signed)

## 2019-12-21 NOTE — Progress Notes (Signed)
Patient received IV Injectafer as ordered by Janyth Pupa DO. Observed for at least 30 minutes post infusion.Tolerated well, vitals stable, discharge instructions given, verbalized understanding. Patient alert, oriented and ambulatory at the time of discharge.

## 2020-01-05 DIAGNOSIS — N898 Other specified noninflammatory disorders of vagina: Secondary | ICD-10-CM | POA: Diagnosis not present

## 2020-01-05 DIAGNOSIS — Z3202 Encounter for pregnancy test, result negative: Secondary | ICD-10-CM | POA: Diagnosis not present

## 2020-01-05 DIAGNOSIS — N920 Excessive and frequent menstruation with regular cycle: Secondary | ICD-10-CM | POA: Diagnosis not present

## 2020-01-17 DIAGNOSIS — N920 Excessive and frequent menstruation with regular cycle: Secondary | ICD-10-CM | POA: Diagnosis not present

## 2020-01-18 DIAGNOSIS — N939 Abnormal uterine and vaginal bleeding, unspecified: Secondary | ICD-10-CM | POA: Diagnosis not present

## 2020-01-18 DIAGNOSIS — Z3009 Encounter for other general counseling and advice on contraception: Secondary | ICD-10-CM | POA: Diagnosis not present

## 2020-01-18 DIAGNOSIS — N946 Dysmenorrhea, unspecified: Secondary | ICD-10-CM | POA: Diagnosis not present

## 2020-01-18 DIAGNOSIS — D250 Submucous leiomyoma of uterus: Secondary | ICD-10-CM | POA: Diagnosis not present

## 2020-01-25 ENCOUNTER — Encounter (HOSPITAL_BASED_OUTPATIENT_CLINIC_OR_DEPARTMENT_OTHER): Payer: Self-pay | Admitting: Obstetrics & Gynecology

## 2020-01-25 ENCOUNTER — Other Ambulatory Visit: Payer: Self-pay

## 2020-01-26 ENCOUNTER — Encounter (HOSPITAL_BASED_OUTPATIENT_CLINIC_OR_DEPARTMENT_OTHER)
Admission: RE | Admit: 2020-01-26 | Discharge: 2020-01-26 | Disposition: A | Discharge status: Home / Self Care | Payer: 59 | Source: Ambulatory Visit | Attending: Obstetrics & Gynecology | Admitting: Obstetrics & Gynecology

## 2020-01-26 DIAGNOSIS — Z01810 Encounter for preprocedural cardiovascular examination: Secondary | ICD-10-CM | POA: Insufficient documentation

## 2020-01-26 LAB — COMPREHENSIVE METABOLIC PANEL
ALT: 10 U/L (ref 0–44)
AST: 13 U/L — ABNORMAL LOW (ref 15–41)
Albumin: 3.6 g/dL (ref 3.5–5.0)
Alkaline Phosphatase: 40 U/L (ref 38–126)
Anion gap: 8 (ref 5–15)
BUN: 9 mg/dL (ref 6–20)
CO2: 24 mmol/L (ref 22–32)
Calcium: 8.8 mg/dL — ABNORMAL LOW (ref 8.9–10.3)
Chloride: 107 mmol/L (ref 98–111)
Creatinine, Ser: 0.88 mg/dL (ref 0.44–1.00)
GFR calc Af Amer: >60 mL/min (ref >60)
GFR calc non Af Amer: >60 mL/min (ref >60)
Glucose, Bld: 85 mg/dL (ref 70–99)
Potassium: 3.6 mmol/L (ref 3.5–5.1)
Sodium: 139 mmol/L (ref 135–145)
Total Bilirubin: 0.2 mg/dL — ABNORMAL LOW (ref 0.3–1.2)
Total Protein: 6.7 g/dL (ref 6.5–8.1)

## 2020-01-26 NOTE — Progress Notes (Signed)

## 2020-01-27 NOTE — H&P (Signed)
34yo G0 who presents for hysteroscopy, myosure myomectomy due to AUB and fibroid.  In review, pt of [facility].  Note that periods very irregular- would last for 2 weeks then one week break then restart. It has been doing this for several months. Bleeding typically requires about 2-3 pads per day. In the past, she has tried Megace, mirena and other medication options- currently on [place].  Work up completed: [place] (01/05/20): 8.7cm anteverted uterus with 2.6cm midbody submucosal fibroid- following SHG- post fundal fibroid- majority within endometrial cavity 2.9c2.7x2.3cm. Thin endometrial walls. Righ ovary with adjacent simple cyst- 1.6cm-stable in size. Normal left ovary Denies vaginal discharge, itching or irritation. Denies pelvic or abdominal pain. Other than the bleeding, she reports no other acute complaints  Current Medications Taking Zyrtec 1 tab Oral MVI Oriahnn(Elagolix-Estrad-Noreth & Elago) 300-1-0.5 & 300 MG Capsule Therapy Pack 1 combined pill in the morning and then the Elagolix by itself pill in the evening. Orally as directed Losartan Potassium 25 MG Tablet 1 tablet by mouth Once a day Metoprolol Tartrate 25 MG Tablet 1 tablet by mouth twice a day Medication List reviewed and reconciled with the patient  Past Medical History Hypertension, benign. Kidney stones. Fever blisters/hsv. Surgical History cystoscopy for kidney stone  Family History  Father: alive, hypertension Mother: alive, heart disease, CABG at age 42, diagnosed with Hypertension Paternal Grand Father: deceased Paternal Grand Mother: deceased Maternal Grand Father: deceased Maternal Grand Mother: alive Sister 1: alive, skin cancer, multiple sclerosis Sister 2: alive 2 sister(s) - healthy  Social History General: Tobacco use cigarettes: Never smoked Tobacco history last updated 01/18/2020 Additional Findings: Tobacco Non-User Non-smoker for personal reasons no EXPOSURE TO PASSIVE SMOKE. Alcohol:  yes, Rare. Caffeine: yes, soda, rare. no Recreational drug use. no Exercise. Marital Status: single. Children: none. OCCUPATION: employed, works Engineer, manufacturing.  Gyn History Sexual activity not currently sexually active. Periods : irregular. LMP 01/05/2020 ? spotting. Last pap smear date 7/21. Denies H/O Last mammogram date 2 years ago, lump in right breast since teen. Abnormal pap smear 12/20/13 - WNL HPV + Colpo ~ 12/16 = WNL per pt. Done in [place].. STD HSV I, Gonorrhea. Menarche 76.  OB History Never been pregnant per patient.  Allergies Metronidazole Elidel Hospitalization/Major Diagnostic Procedure kidney stones Review of Systems  CONSTITUTIONAL: no Chills. no Fever. no Night sweats. HEENT: Blurrred vision no. no Double vision. CARDIOLOGY: no Chest pain. RESPIRATORY: no Shortness of breath. no Cough. UROLOGY: no Urinary frequency. no Urinary incontinence. no Urinary urgency. Heiden, Mio L DOB: [birth year] (34 yo F) Acc No. [id number] DOS: 01/18/2020 Provider: Janyth Pupa, DO Date: 01/18/2020 Note generated by eClinicalWorks EMR/PM Software ([email or web address]) 01/27/2020 Print Preview 3/4 GASTROENTEROLOGY: no Abdominal pain. no Appetite change. no Change in bowel movements. FEMALE REPRODUCTIVE: no Breast lumps or discharge. no Breast pain. NEUROLOGY: no Dizziness. no Headache. no Loss of consciousness. PSYCHOLOGY: no Anxiety. no Depression. SKIN: no Rash. no Hives. HEMATOLOGY/LYMPH: no Anemia. no Fatigue. Using Blood Thinners no.  Exam completed in office Vital Signs Wt 126.5, Wt change -2.3 lb, Ht 63.25, BMI 22.23, Pulse sitting 70, BP sitting 120/72. Examination General Examination: CONSTITUTIONAL: well developed, well nourished. SKIN: warm and dry, no rashes. NECK: supple, normal appearance. LUNGS: clear to auscultation bilaterally, no wheezes, rhonchi, rales. HEART: no murmurs, regular rate and rhythm. ABDOMEN: soft and not  tender, no rebound, no rigidity. MUSCULOSKELETAL no calf tenderness bilaterally. EXTREMITIES: no edema present. PSYCH: appropriate mood and affect.   A/P: 34yo G0 who presents for hysteroscopy, myosure  myomectomy due to AUB and uterine fibroid -NPO -LR @ 125cc/hr -SCDs to OR -Risk/benefit reviewed with patient including but not limited to risk of bleeding, infection, injury and potential inability for complete resection.  Question and concerns were addressed and she desires to proceed  Janyth Pupa, DO [phone] (cell) [phone] (office)

## 2020-01-28 ENCOUNTER — Other Ambulatory Visit (HOSPITAL_COMMUNITY)
Admission: RE | Admit: 2020-01-28 | Discharge: 2020-01-28 | Disposition: A | Discharge status: Home / Self Care | Payer: 59 | Source: Ambulatory Visit | Attending: Obstetrics & Gynecology | Admitting: Obstetrics & Gynecology

## 2020-01-28 DIAGNOSIS — Z01812 Encounter for preprocedural laboratory examination: Secondary | ICD-10-CM | POA: Insufficient documentation

## 2020-01-28 DIAGNOSIS — Z20822 Contact with and (suspected) exposure to covid-19: Secondary | ICD-10-CM | POA: Diagnosis not present

## 2020-01-28 LAB — SARS CORONAVIRUS 2 (TAT 6-24 HRS): SARS Coronavirus 2: NEGATIVE

## 2020-02-01 ENCOUNTER — Other Ambulatory Visit: Payer: Self-pay

## 2020-02-01 ENCOUNTER — Ambulatory Visit (HOSPITAL_BASED_OUTPATIENT_CLINIC_OR_DEPARTMENT_OTHER): Payer: 59 | Admitting: Certified Registered Nurse Anesthetist

## 2020-02-01 ENCOUNTER — Ambulatory Visit (HOSPITAL_BASED_OUTPATIENT_CLINIC_OR_DEPARTMENT_OTHER)
Admission: RE | Admit: 2020-02-01 | Discharge: 2020-02-01 | Disposition: A | Discharge status: Home / Self Care | Payer: 59 | Attending: Obstetrics & Gynecology | Admitting: Obstetrics & Gynecology

## 2020-02-01 ENCOUNTER — Encounter (HOSPITAL_BASED_OUTPATIENT_CLINIC_OR_DEPARTMENT_OTHER)
Admission: RE | Disposition: A | Discharge status: Home / Self Care | Payer: Self-pay | Source: Home / Self Care | Attending: Obstetrics & Gynecology

## 2020-02-01 ENCOUNTER — Encounter (HOSPITAL_BASED_OUTPATIENT_CLINIC_OR_DEPARTMENT_OTHER): Payer: Self-pay | Admitting: Obstetrics & Gynecology

## 2020-02-01 DIAGNOSIS — R69 Illness, unspecified: Secondary | ICD-10-CM | POA: Diagnosis not present

## 2020-02-01 DIAGNOSIS — D259 Leiomyoma of uterus, unspecified: Secondary | ICD-10-CM | POA: Diagnosis not present

## 2020-02-01 DIAGNOSIS — D630 Anemia in neoplastic disease: Secondary | ICD-10-CM | POA: Diagnosis not present

## 2020-02-01 DIAGNOSIS — I10 Essential (primary) hypertension: Secondary | ICD-10-CM | POA: Diagnosis not present

## 2020-02-01 DIAGNOSIS — N939 Abnormal uterine and vaginal bleeding, unspecified: Secondary | ICD-10-CM

## 2020-02-01 DIAGNOSIS — Z79899 Other long term (current) drug therapy: Secondary | ICD-10-CM | POA: Diagnosis not present

## 2020-02-01 DIAGNOSIS — D251 Intramural leiomyoma of uterus: Secondary | ICD-10-CM | POA: Diagnosis not present

## 2020-02-01 HISTORY — DX: Anxiety disorder, unspecified: F41.9

## 2020-02-01 HISTORY — DX: Other specified postprocedural states: Z98.890

## 2020-02-01 HISTORY — PX: DILATATION & CURETTAGE/HYSTEROSCOPY WITH MYOSURE: SHX6511

## 2020-02-01 HISTORY — DX: Anemia, unspecified: D64.9

## 2020-02-01 HISTORY — DX: Nausea with vomiting, unspecified: R11.2

## 2020-02-01 LAB — POCT PREGNANCY, URINE: Preg Test, Ur: NEGATIVE

## 2020-02-01 SURGERY — DILATATION & CURETTAGE/HYSTEROSCOPY WITH MYOSURE
Anesthesia: General | Site: Vagina

## 2020-02-01 MED ORDER — MEPERIDINE HCL 25 MG/ML IJ SOLN: 6.2500 mg | INTRAMUSCULAR | Status: DC | PRN

## 2020-02-01 MED ORDER — MIDAZOLAM HCL 2 MG/2ML IJ SOLN
INTRAMUSCULAR | Status: DC | PRN
  Administered 2020-02-01: 2 mg via INTRAVENOUS

## 2020-02-01 MED ORDER — PROPOFOL 10 MG/ML IV BOLUS
INTRAVENOUS | Status: DC | PRN
  Administered 2020-02-01: 200 mg via INTRAVENOUS

## 2020-02-01 MED ORDER — PROMETHAZINE HCL 25 MG/ML IJ SOLN: 6.2500 mg | INTRAMUSCULAR | Status: DC | PRN

## 2020-02-01 MED ORDER — OXYCODONE HCL 5 MG PO TABS
ORAL_TABLET | ORAL | Status: AC
  Filled 2020-02-01: qty 1

## 2020-02-01 MED ORDER — MIDAZOLAM HCL 2 MG/2ML IJ SOLN
INTRAMUSCULAR | Status: AC
  Filled 2020-02-01: qty 2

## 2020-02-01 MED ORDER — LIDOCAINE-EPINEPHRINE 1 %-1:100000 IJ SOLN
INTRAMUSCULAR | Status: DC | PRN
  Administered 2020-02-01: 20 mL

## 2020-02-01 MED ORDER — LACTATED RINGERS IV SOLN: INTRAVENOUS | Status: DC

## 2020-02-01 MED ORDER — OXYCODONE HCL 5 MG PO TABS
5.0000 mg | ORAL_TABLET | Freq: Once | ORAL | Status: AC | PRN
  Administered 2020-02-01: 5 mg via ORAL

## 2020-02-01 MED ORDER — FENTANYL CITRATE (PF) 100 MCG/2ML IJ SOLN
INTRAMUSCULAR | Status: AC
  Filled 2020-02-01: qty 2

## 2020-02-01 MED ORDER — FENTANYL CITRATE (PF) 100 MCG/2ML IJ SOLN
INTRAMUSCULAR | Status: DC | PRN
Start: 2020-02-01 — End: 2020-02-01
  Administered 2020-02-01: 50 ug via INTRAVENOUS
  Administered 2020-02-01: 25 ug via INTRAVENOUS

## 2020-02-01 MED ORDER — LIDOCAINE 2% (20 MG/ML) 5 ML SYRINGE
INTRAMUSCULAR | Status: DC | PRN
  Administered 2020-02-01: 100 mg via INTRAVENOUS

## 2020-02-01 MED ORDER — ONDANSETRON HCL 4 MG/2ML IJ SOLN
INTRAMUSCULAR | Status: AC
  Filled 2020-02-01: qty 2

## 2020-02-01 MED ORDER — ONDANSETRON HCL 4 MG/2ML IJ SOLN
INTRAMUSCULAR | Status: DC | PRN
  Administered 2020-02-01: 4 mg via INTRAVENOUS

## 2020-02-01 MED ORDER — SODIUM CHLORIDE 0.9 % IR SOLN
Status: DC | PRN
  Administered 2020-02-01 (×2): 3000 mL

## 2020-02-01 MED ORDER — HYDROMORPHONE HCL 1 MG/ML IJ SOLN: 0.2500 mg | INTRAMUSCULAR | Status: DC | PRN

## 2020-02-01 MED ORDER — POVIDONE-IODINE 10 % EX SWAB: 2.0000 "application " | Freq: Once | CUTANEOUS | Status: DC

## 2020-02-01 MED ORDER — FERRIC SUBSULFATE 259 MG/GM EX SOLN
CUTANEOUS | Status: AC
  Filled 2020-02-01: qty 8

## 2020-02-01 MED ORDER — FERRIC SUBSULFATE 259 MG/GM EX SOLN
CUTANEOUS | Status: DC | PRN
  Administered 2020-02-01: 1 via TOPICAL

## 2020-02-01 MED ORDER — DROPERIDOL 2.5 MG/ML IJ SOLN
INTRAMUSCULAR | Status: AC
  Filled 2020-02-01: qty 2

## 2020-02-01 MED ORDER — OXYCODONE HCL 5 MG PO TABS
5.0000 mg | ORAL_TABLET | Freq: Four times a day (QID) | ORAL | 0 refills | Status: AC | PRN
Start: 2020-02-01 — End: 2020-02-04

## 2020-02-01 MED ORDER — DROPERIDOL 2.5 MG/ML IJ SOLN
INTRAMUSCULAR | Status: DC | PRN
  Administered 2020-02-01: .625 mg via INTRAVENOUS

## 2020-02-01 MED ORDER — DEXAMETHASONE SODIUM PHOSPHATE 10 MG/ML IJ SOLN
INTRAMUSCULAR | Status: AC
  Filled 2020-02-01: qty 1

## 2020-02-01 MED ORDER — DEXAMETHASONE SODIUM PHOSPHATE 10 MG/ML IJ SOLN
INTRAMUSCULAR | Status: DC | PRN
  Administered 2020-02-01: 10 mg via INTRAVENOUS

## 2020-02-01 MED ORDER — OXYCODONE HCL 5 MG/5ML PO SOLN: 5.0000 mg | Freq: Once | ORAL | Status: AC | PRN

## 2020-02-01 MED ORDER — LIDOCAINE 2% (20 MG/ML) 5 ML SYRINGE
INTRAMUSCULAR | Status: AC
  Filled 2020-02-01: qty 10

## 2020-02-01 SURGICAL SUPPLY — 20 items
CATH ROBINSON RED A/P 16FR (CATHETERS) IMPLANT
DEVICE MYOSURE LITE (MISCELLANEOUS) IMPLANT
DEVICE MYOSURE REACH (MISCELLANEOUS) IMPLANT
DILATOR CANAL MILEX (MISCELLANEOUS) IMPLANT
GAUZE 4X4 16PLY RFD (DISPOSABLE) ×2 IMPLANT
GLOVE BIOGEL PI IND STRL 6.5 (GLOVE) ×1 IMPLANT
GLOVE BIOGEL PI IND STRL 7.0 (GLOVE) ×1 IMPLANT
GLOVE BIOGEL PI INDICATOR 6.5 (GLOVE) ×1
GLOVE BIOGEL PI INDICATOR 7.0 (GLOVE) ×1
GLOVE ECLIPSE 6.5 STRL STRAW (GLOVE) ×2 IMPLANT
GOWN STRL REUS W/TWL LRG LVL3 (GOWN DISPOSABLE) ×4 IMPLANT
KIT PROCEDURE FLUENT (KITS) ×2 IMPLANT
MYOSURE XL FIBROID (MISCELLANEOUS) ×2
PACK VAGINAL MINOR WOMEN LF (CUSTOM PROCEDURE TRAY) ×2 IMPLANT
PAD OB MATERNITY 4.3X12.25 (PERSONAL CARE ITEMS) ×2 IMPLANT
PAD PREP 24X48 CUFFED NSTRL (MISCELLANEOUS) ×2 IMPLANT
SEAL ROD LENS SCOPE MYOSURE (ABLATOR) ×2 IMPLANT
SLEEVE SCD COMPRESS KNEE MED (MISCELLANEOUS) ×2 IMPLANT
SYSTEM TISS REMOVAL MYOSURE XL (MISCELLANEOUS) ×1 IMPLANT
TOWEL GREEN STERILE FF (TOWEL DISPOSABLE) ×4 IMPLANT

## 2020-02-01 NOTE — Anesthesia Procedure Notes (Signed)
Procedure Name: LMA Insertion Date/Time: 02/01/2020 11:58 AM Performed by: [place] Gjon Letarte C, CRNA Pre-anesthesia Checklist: Patient identified, Emergency Drugs available, Suction available and Patient being monitored Patient Re-evaluated:Patient Re-evaluated prior to induction Oxygen Delivery Method: Circle system utilized Preoxygenation: Pre-oxygenation with 100% oxygen Induction Type: IV induction Ventilation: Mask ventilation without difficulty LMA: LMA inserted LMA Size: 4.0 Number of attempts: 1 Airway Equipment and Method: Bite block Placement Confirmation: positive ETCO2 Tube secured with: Tape Dental Injury: Teeth and Oropharynx as per pre-operative assessment

## 2020-02-01 NOTE — Discharge Instructions (Addendum)
HOME INSTRUCTIONS  Please note any unusual or excessive bleeding, pain, swelling. Mild dizziness or drowsiness are normal for about 24 hours after surgery.   Shower when comfortable  Restrictions: No driving for 24 hours or while taking pain medications.  Activity:  No heavy lifting (> 10 lbs), nothing in vagina (no tampons, douching, or intercourse) x 2 weeks; no tub baths for 2 weeks Vaginal spotting is expected but if your bleeding is heavy, period like,  please call the office    Diet:  You may return to your regular diet.  Do not eat large meals.  Eat small frequent meals throughout the day.  Continue to drink a good amount of water at least 6-8 glasses of water per day, hydration is very important for the healing process.  Pain Management: Take Motrin(ibuprofen) and/or Tylenol available over the counter.  If you are still noting moderate to severe pain, you may take oxycodone 1 tablet as needed.  This medication may cause constipation- be sure to take a stool softener, should you note constipation Always take prescription pain medication with food, it may cause constipation, increase fluids and fiber and you may want to take an over-the-counter stool softener like Colace as needed up to 2x a day.    Alcohol -- Avoid for 24 hours and while taking pain medications.  Nausea: Take sips of ginger ale or soda  Fever -- Call physician if temperature over 101 degrees  Follow up:  If you do not already have a follow up appointment scheduled, please call the office at [phone].  If you experience fever (a temperature greater than 100.4), pain unrelieved by pain medication, shortness of breath, swelling of a single leg, or any other symptoms which are concerning to you please the office immediately.   Post Anesthesia Home Care Instructions  Activity: Get plenty of rest for the remainder of the day. A responsible individual must stay with you for 24 hours following the procedure.  For the  next 24 hours, DO NOT: -Drive a car -Paediatric nurse -Drink alcoholic beverages -Take any medication unless instructed by your physician -Make any legal decisions or sign important papers.  Meals: Start with liquid foods such as gelatin or soup. Progress to regular foods as tolerated. Avoid greasy, spicy, heavy foods. If nausea and/or vomiting occur, drink only clear liquids until the nausea and/or vomiting subsides. Call your physician if vomiting continues.  Special Instructions/Symptoms: Your throat may feel dry or sore from the anesthesia or the breathing tube placed in your throat during surgery. If this causes discomfort, gargle with warm salt water. The discomfort should disappear within 24 hours.  If you had a scopolamine patch placed behind your ear for the management of post- operative nausea and/or vomiting:  1. The medication in the patch is effective for 72 hours, after which it should be removed.  Wrap patch in a tissue and discard in the trash. Wash hands thoroughly with soap and water. 2. You may remove the patch earlier than 72 hours if you experience unpleasant side effects which may include dry mouth, dizziness or visual disturbances. 3. Avoid touching the patch. Wash your hands with soap and water after contact with the patch.

## 2020-02-01 NOTE — Interval H&P Note (Signed)
History and Physical Interval Note:  02/01/2020 11:39 AM  Elizabeth Ponce  has presented today for surgery, with the diagnosis of N93.9 AUB D25.0 Fibroids.  The various methods of treatment have been discussed with the patient and family. After consideration of risks, benefits and other options for treatment, the patient has consented to  Procedure(s): Phelps (N/A) as a surgical intervention.  The patient's history has been reviewed, patient examined, no change in status, stable for surgery.  I have reviewed the patient's chart and labs.  Questions were answered to the patient's satisfaction.     Annalee Genta

## 2020-02-01 NOTE — Anesthesia Postprocedure Evaluation (Signed)
Anesthesia Post Note  Patient: Associate Professor  Procedure(s) Performed: DILATATION & CURETTAGE/HYSTEROSCOPY WITH MYOSURE, Uterine Fibroid (N/A Vagina )     Patient location during evaluation: PACU Anesthesia Type: General Level of consciousness: awake and alert Pain management: pain level controlled Vital Signs Assessment: post-procedure vital signs reviewed and stable Respiratory status: spontaneous breathing, nonlabored ventilation and respiratory function stable Cardiovascular status: blood pressure returned to baseline and stable Postop Assessment: no apparent nausea or vomiting Anesthetic complications: no   No complications documented.  Last Vitals:  Vitals:   02/01/20 1315 02/01/20 1422  BP: (!) 139/92 124/85  Pulse: 65 77  Resp: 16 20  Temp:  36.4 C  SpO2: 100% 100%    Last Pain:  Vitals:   02/01/20 1422  TempSrc: Oral  PainSc: 4                  Lynda Rainwater

## 2020-02-01 NOTE — Transfer of Care (Signed)
Immediate Anesthesia Transfer of Care Note  Patient: Associate Professor  Procedure(s) Performed: DILATATION & CURETTAGE/HYSTEROSCOPY WITH MYOSURE (N/A Vagina )  Patient Location: PACU  Anesthesia Type:General  Level of Consciousness: drowsy  Airway & Oxygen Therapy: Patient Spontanous Breathing and Patient connected to face mask oxygen  Post-op Assessment: Report given to RN and Post -op Vital signs reviewed and stable  Post vital signs: Reviewed and stable  Last Vitals:  Vitals Value Taken Time  BP 146/98 02/01/20 1245  Temp    Pulse 76 02/01/20 1246  Resp 18 02/01/20 1246  SpO2 100 % 02/01/20 1246  Vitals shown include unvalidated device data.  Last Pain:  Vitals:   02/01/20 1121  TempSrc: Oral  PainSc: 0-No pain         Complications: No complications documented.

## 2020-02-01 NOTE — Anesthesia Preprocedure Evaluation (Signed)
Anesthesia Evaluation  Patient identified by MRN, date of birth, ID band Patient awake    Reviewed: Allergy & Precautions, NPO status , Patient's Chart, lab work & pertinent test results  History of Anesthesia Complications (+) PONV  Airway Mallampati: II  TM Distance: >3 FB Neck ROM: Full    Dental no notable dental hx.    Pulmonary neg pulmonary ROS,    Pulmonary exam normal breath sounds clear to auscultation       Cardiovascular hypertension, negative cardio ROS Normal cardiovascular exam Rhythm:Regular Rate:Normal     Neuro/Psych Anxiety negative neurological ROS  negative psych ROS   GI/Hepatic negative GI ROS, Neg liver ROS,   Endo/Other  negative endocrine ROS  Renal/GU negative Renal ROS  negative genitourinary   Musculoskeletal negative musculoskeletal ROS (+)   Abdominal   Peds negative pediatric ROS (+)  Hematology negative hematology ROS (+)   Anesthesia Other Findings   Reproductive/Obstetrics negative OB ROS                             Anesthesia Physical Anesthesia Plan  ASA: II  Anesthesia Plan: General   Post-op Pain Management:    Induction: Intravenous  PONV Risk Score and Plan: 4 or greater and Ondansetron, Dexamethasone, Midazolam, Droperidol and Treatment may vary due to age or medical condition  Airway Management Planned: LMA  Additional Equipment:   Intra-op Plan:   Post-operative Plan: Extubation in OR  Informed Consent: I have reviewed the patients History and Physical, chart, labs and discussed the procedure including the risks, benefits and alternatives for the proposed anesthesia with the patient or authorized representative who has indicated his/her understanding and acceptance.     Dental advisory given  Plan Discussed with: CRNA  Anesthesia Plan Comments:         Anesthesia Quick Evaluation

## 2020-02-01 NOTE — Op Note (Signed)
Operative Report  PreOp: abnormal uterine bleeding, uterine fibroid PostOp: same Procedure:  Hysteroscopy, Dilation and Curettage, Myomectomy Surgeon: Dr. Janyth Pupa Anesthesia: General, cervical block Complications:none EBL: 88TG IVF:500cc Discrepancy: 615cc  Findings:8cm anteverted uterus with ~3cm intracavity uterine fibroid noted, bilateral ostia visualized Specimens: uterine fibroid  Procedure: The patient was taken to the operating room where she underwent general anesthesia without difficulty. The patient was placed in a low lithotomy position using Allen stirrups. The patient was examined with the findings as noted above.  She was then prepped and draped in the normal sterile fashion. A sterile speculum was inserted into the vagina. Cervical block was completed using 54DI of 1% licodaine with epinephrine.  A single tooth tenaculum was placed on the anterior lip of the cervix. The uterus was then sounded to 8cm. Dilation was not needed.  The diagnostic hysteroscope was then inserted without difficulty and noted to have the findings as listed above. Visualization was achieved using NS as a distending medium. The myosure was inserted and resection of the fibroid was completed.  An additional tenaculum was placed on the cervix to help with adequate fluid monitoring.  Once resection was completed, the myosure was removed.  No uterine perforation was seen. All instrument were removed. Hemostasis was obtained at the cervical site using Monsel's.  The patient was repositioned to the supine position. The patient tolerated the procedure without any complications and taken to recovery in stable condition.   Janyth Pupa, DO [phone] (pager) [phone] (office)

## 2020-02-02 ENCOUNTER — Encounter (HOSPITAL_BASED_OUTPATIENT_CLINIC_OR_DEPARTMENT_OTHER): Payer: Self-pay | Admitting: Obstetrics & Gynecology

## 2020-02-02 LAB — SURGICAL PATHOLOGY

## 2020-03-08 DIAGNOSIS — Z03818 Encounter for observation for suspected exposure to other biological agents ruled out: Secondary | ICD-10-CM | POA: Diagnosis not present

## 2020-03-08 DIAGNOSIS — Z1152 Encounter for screening for COVID-19: Secondary | ICD-10-CM | POA: Diagnosis not present

## 2020-03-09 ENCOUNTER — Encounter (HOSPITAL_COMMUNITY): Payer: Self-pay | Admitting: Emergency Medicine

## 2020-03-09 ENCOUNTER — Other Ambulatory Visit: Payer: Self-pay

## 2020-03-09 ENCOUNTER — Emergency Department (HOSPITAL_COMMUNITY)
Admission: EM | Admit: 2020-03-09 | Discharge: 2020-03-10 | Disposition: A | Discharge status: Home / Self Care | Payer: 59 | Attending: Emergency Medicine | Admitting: Emergency Medicine

## 2020-03-09 DIAGNOSIS — I10 Essential (primary) hypertension: Secondary | ICD-10-CM | POA: Insufficient documentation

## 2020-03-09 DIAGNOSIS — R0600 Dyspnea, unspecified: Secondary | ICD-10-CM | POA: Insufficient documentation

## 2020-03-09 DIAGNOSIS — R519 Headache, unspecified: Secondary | ICD-10-CM | POA: Diagnosis not present

## 2020-03-09 DIAGNOSIS — R5381 Other malaise: Secondary | ICD-10-CM | POA: Diagnosis not present

## 2020-03-09 DIAGNOSIS — R600 Localized edema: Secondary | ICD-10-CM | POA: Diagnosis not present

## 2020-03-09 DIAGNOSIS — Z79899 Other long term (current) drug therapy: Secondary | ICD-10-CM | POA: Insufficient documentation

## 2020-03-09 DIAGNOSIS — R5383 Other fatigue: Secondary | ICD-10-CM | POA: Insufficient documentation

## 2020-03-09 DIAGNOSIS — D649 Anemia, unspecified: Secondary | ICD-10-CM | POA: Insufficient documentation

## 2020-03-09 DIAGNOSIS — R42 Dizziness and giddiness: Secondary | ICD-10-CM | POA: Diagnosis not present

## 2020-03-09 DIAGNOSIS — I2510 Atherosclerotic heart disease of native coronary artery without angina pectoris: Secondary | ICD-10-CM | POA: Diagnosis not present

## 2020-03-09 LAB — BASIC METABOLIC PANEL
Anion gap: 11 (ref 5–15)
BUN: 8 mg/dL (ref 6–20)
CO2: 25 mmol/L (ref 22–32)
Calcium: 9.6 mg/dL (ref 8.9–10.3)
Chloride: 103 mmol/L (ref 98–111)
Creatinine, Ser: 0.78 mg/dL (ref 0.44–1.00)
GFR calc Af Amer: >60 mL/min (ref >60)
GFR calc non Af Amer: >60 mL/min (ref >60)
Glucose, Bld: 106 mg/dL — ABNORMAL HIGH (ref 70–99)
Potassium: 4.3 mmol/L (ref 3.5–5.1)
Sodium: 139 mmol/L (ref 135–145)

## 2020-03-09 LAB — CBC
HCT: 30.4 % — ABNORMAL LOW (ref 36.0–46.0)
Hemoglobin: 10 g/dL — ABNORMAL LOW (ref 12.0–15.0)
MCH: 28.8 pg (ref 26.0–34.0)
MCHC: 32.9 g/dL (ref 30.0–36.0)
MCV: 87.6 fL (ref 80.0–100.0)
Platelets: 329 10*3/uL (ref 150–400)
RBC: 3.47 MIL/uL — ABNORMAL LOW (ref 3.87–5.11)
RDW: 15.2 % (ref 11.5–15.5)
WBC: 6 10*3/uL (ref 4.0–10.5)
nRBC: 0 % (ref 0.0–0.2)

## 2020-03-09 LAB — URINALYSIS, ROUTINE W REFLEX MICROSCOPIC
Bacteria, UA: NONE SEEN
Bilirubin Urine: NEGATIVE
Glucose, UA: NEGATIVE mg/dL
Hgb urine dipstick: NEGATIVE
Ketones, ur: NEGATIVE mg/dL
Nitrite: NEGATIVE
Protein, ur: NEGATIVE mg/dL
Specific Gravity, Urine: 1.016 (ref 1.005–1.030)
pH: 5 (ref 5.0–8.0)

## 2020-03-09 LAB — I-STAT BETA HCG BLOOD, ED (MC, WL, AP ONLY): I-stat hCG, quantitative: <5 m[IU]/mL (ref <5)

## 2020-03-09 NOTE — ED Triage Notes (Signed)
Pt arrived POV c/o breathing being "off", lightheaded, HA x 2 days. Denies fever, nausea last night, no vomiting/diarrhea.  Pt reports she had POC Covid yesterday that was neg. Pt is neuro intact, no distress.

## 2020-03-10 ENCOUNTER — Emergency Department (HOSPITAL_COMMUNITY): Payer: 59

## 2020-03-10 DIAGNOSIS — R42 Dizziness and giddiness: Secondary | ICD-10-CM | POA: Diagnosis not present

## 2020-03-10 MED ORDER — CEPHALEXIN 500 MG PO CAPS: 500.0000 mg | ORAL_CAPSULE | Freq: Four times a day (QID) | ORAL | 0 refills | Status: DC

## 2020-03-10 MED ORDER — IOHEXOL 350 MG/ML SOLN
75.0000 mL | Freq: Once | INTRAVENOUS | Status: AC | PRN
  Administered 2020-03-10: 75 mL via INTRAVENOUS

## 2020-03-10 NOTE — ED Provider Notes (Signed)
[facility] EMERGENCY DEPARTMENT Provider Note   CSN: [id number] Arrival date & time: 03/09/20  1859     History Chief Complaint  Patient presents with  . Dizziness    Elizabeth Ponce is a 34 y.o. female.  Patient c/o intermittent frontal headache, and generally 'not feeling right', in past couple days. Headaches intermittently, mild-mod. No acute, abrupt, persistent or severe headaches. Had felt mildly sob earlier, but states feels is breathing fine now, at baseline. No orthopnea/pnd. No current or prior chest pain or discomfort. No cough or uri symptoms. No known covid exposure, and states recent covid test in past couple days negative. No abd pain or nvd. No rectal bleeding or melena. No recent blood loss/vaginal bleeding - indicates had procedure in August for fibroids, no complications, no bleeding since. Denies fever/chills. Notes hx utis, no current dysuria.   The history is provided by the patient.  Dizziness Associated symptoms: headaches   Associated symptoms: no blood in stool, no chest pain, no diarrhea, no palpitations, no vomiting and no weakness        Past Medical History:  Diagnosis Date  . Anemia   . Anxiety   . Hypertension   . Kidney stones   . PONV (postoperative nausea and vomiting)     Patient Active Problem List   Diagnosis Date Noted  . CIN I (cervical intraepithelial neoplasia I) 11/06/2015  . Chest discomfort 10/12/2015  . FH: CAD (coronary artery disease) 10/12/2015  . Sinus arrhythmia 10/12/2015  . ASCUS with positive high risk human papillomavirus of vagina 01/12/2015  . Contact dermatitis and other eczema, due to unspecified cause 04/28/2013  . Disorder of skin or subcutaneous tissue 07/15/2012  . Other acne 05/27/2012  . COUGH 05/11/2009  . IRRITABLE BOWEL SYNDROME 03/19/2009  . HEMATOCHEZIA 03/16/2009  . UNSPECIFIED HEMORRHOIDS WITH OTHER COMPLICATION 50/27/7412  . VAGINITIS 08/17/2008  . HAIR LOSS 12/22/2007  .  LACTOSE INTOLERANCE 02/04/2007  . HYPERTENSION, BENIGN 09/15/2006  . RENAL STONE 09/15/2006  . HYPERHIDROSIS 09/15/2006    Past Surgical History:  Procedure Laterality Date  . CYSTOSCOPY     FOR KIDNEY STONES  . DILATATION & CURETTAGE/HYSTEROSCOPY WITH MYOSURE N/A 02/01/2020   Procedure: DILATATION & CURETTAGE/HYSTEROSCOPY WITH MYOSURE, Uterine Fibroid;  Surgeon: Janyth Pupa, DO;  Location: [facility];  Service: Gynecology;  Laterality: N/A;     OB History   No obstetric history on file.     Family History  Problem Relation Age of Onset  . Hypertension Father   . Heart disease Father 72       CABG     Social History   Tobacco Use  . Smoking status: Never Smoker  . Smokeless tobacco: Never Used  Substance Use Topics  . Alcohol use: Not Currently  . Drug use: No    Home Medications Prior to Admission medications   Medication Sig Start Date End Date Taking? Authorizing Provider  acetaminophen (TYLENOL) 325 MG tablet Take 650 mg by mouth every 6 (six) hours as needed.    [provider]  cetirizine (ZYRTEC) 10 MG tablet Take 10 mg by mouth daily.    [provider]  losartan (COZAAR) 25 MG tablet Take 25 mg by mouth daily.    [provider]  metoprolol tartrate (LOPRESSOR) 25 MG tablet Take 25 mg by mouth 2 (two) times daily.     [provider]  Multiple Vitamins-Minerals (MULTIVITAMIN PO) Take by mouth.    [provider]  Allergies    Elidel [pimecrolimus] and Metronidazole  Review of Systems   Review of Systems  Constitutional: Negative for chills and fever.  HENT: Negative for sore throat.   Eyes: Negative for pain and visual disturbance.  Respiratory: Negative for cough.   Cardiovascular: Negative for chest pain, palpitations and leg swelling.  Gastrointestinal: Negative for abdominal pain, blood in stool, diarrhea and vomiting.  Genitourinary: Negative for flank pain.  Musculoskeletal:  Negative for back pain and neck pain.  Skin: Negative for rash.  Neurological: Positive for headaches. Negative for syncope, weakness and numbness.  Hematological: Does not bruise/bleed easily.  Psychiatric/Behavioral: Negative for confusion.    Physical Exam Updated Vital Signs BP (!) 155/91 (BP Location: Right Arm)   Pulse 73   Temp 99.4 F (37.4 C) (Oral)   Resp 18   LMP 02/24/2020   SpO2 100%   Physical Exam Vitals and nursing note reviewed.  Constitutional:      Appearance: Normal appearance. She is well-developed.  HENT:     Head: Atraumatic.     Nose: Nose normal.     Mouth/Throat:     Mouth: Mucous membranes are moist.  Eyes:     General: No scleral icterus.    Conjunctiva/sclera: Conjunctivae normal.     Pupils: Pupils are equal, round, and reactive to light.  Neck:     Vascular: No carotid bruit.     Trachea: No tracheal deviation.  Cardiovascular:     Rate and Rhythm: Normal rate and regular rhythm.     Pulses: Normal pulses.     Heart sounds: Normal heart sounds. No murmur heard.  No friction rub. No gallop.   Pulmonary:     Effort: Pulmonary effort is normal. No respiratory distress.     Breath sounds: Normal breath sounds.  Abdominal:     General: Bowel sounds are normal. There is no distension.     Palpations: Abdomen is soft.     Tenderness: There is no abdominal tenderness. There is no guarding.  Genitourinary:    Comments: No cva tenderness.  Musculoskeletal:        General: No tenderness.     Cervical back: Normal range of motion and neck supple. No rigidity. No muscular tenderness.     Comments: Trace bil ankle edema, symmetric. No calf or lower leg pain, swelling, or tenderness.   Skin:    General: Skin is warm and dry.     Findings: No rash.  Neurological:     Mental Status: She is alert.     Comments: Alert, speech normal. Motor/sens grossly intact bil. Steady gait.   Psychiatric:        Mood and Affect: Mood normal.     ED Results  / Procedures / Treatments   Labs (all labs ordered are listed, but only abnormal results are displayed) Results for orders placed or performed during the hospital encounter of 17/51/02  Basic metabolic panel  Result Value Ref Range   Sodium 139 135 - 145 mmol/L   Potassium 4.3 3.5 - 5.1 mmol/L   Chloride 103 98 - 111 mmol/L   CO2 25 22 - 32 mmol/L   Glucose, Bld 106 (H) 70 - 99 mg/dL   BUN 8 6 - 20 mg/dL   Creatinine, Ser 0.78 0.44 - 1.00 mg/dL   Calcium 9.6 8.9 - 10.3 mg/dL   GFR calc non Af Amer >60 >60 mL/min   GFR calc Af Amer >60 >60 mL/min   Anion gap  11 5 - 15  CBC  Result Value Ref Range   WBC 6.0 4.0 - 10.5 K/uL   RBC 3.47 (L) 3.87 - 5.11 MIL/uL   Hemoglobin 10.0 (L) 12.0 - 15.0 g/dL   HCT 30.4 (L) 36 - 46 %   MCV 87.6 80.0 - 100.0 fL   MCH 28.8 26.0 - 34.0 pg   MCHC 32.9 30.0 - 36.0 g/dL   RDW 15.2 11.5 - 15.5 %   Platelets 329 150 - 400 K/uL   nRBC 0.0 0.0 - 0.2 %  Urinalysis, Routine w reflex microscopic Urine, Clean Catch  Result Value Ref Range   Color, Urine YELLOW YELLOW   APPearance HAZY (A) CLEAR   Specific Gravity, Urine 1.016 1.005 - 1.030   pH 5.0 5.0 - 8.0   Glucose, UA NEGATIVE NEGATIVE mg/dL   Hgb urine dipstick NEGATIVE NEGATIVE   Bilirubin Urine NEGATIVE NEGATIVE   Ketones, ur NEGATIVE NEGATIVE mg/dL   Protein, ur NEGATIVE NEGATIVE mg/dL   Nitrite NEGATIVE NEGATIVE   Leukocytes,Ua MODERATE (A) NEGATIVE   RBC / HPF 0-5 0 - 5 RBC/hpf   WBC, UA 11-20 0 - 5 WBC/hpf   Bacteria, UA NONE SEEN NONE SEEN   Squamous Epithelial / LPF 11-20 0 - 5   Mucus PRESENT   I-Stat beta hCG blood, ED  Result Value Ref Range   I-stat hCG, quantitative <5.0 <5 mIU/mL   Comment 3            EKG EKG Interpretation  Date/Time:  Friday March 09 2020 19:21:39 EDT Ventricular Rate:  72 PR Interval:  130 QRS Duration: 72 QT Interval:  378 QTC Calculation: 413 R Axis:   64 Text Interpretation: Normal sinus rhythm Nonspecific T wave abnormality No significant  change since last tracing Confirmed by Lajean Saver [phone]) on 03/10/2020 6:59:29 AM   Radiology CT Angio Chest PE W/Cm &/Or Wo Cm  Result Date: 03/10/2020 CLINICAL DATA:  Headache and lightheaded for 2 days EXAM: CT ANGIOGRAPHY CHEST WITH CONTRAST TECHNIQUE: Multidetector CT imaging of the chest was performed using the standard protocol during bolus administration of intravenous contrast. Multiplanar CT image reconstructions and MIPs were obtained to evaluate the vascular anatomy. CONTRAST:  5mL OMNIPAQUE IOHEXOL 350 MG/ML SOLN COMPARISON:  None. FINDINGS: Cardiovascular: Satisfactory opacification of the pulmonary arteries to the segmental level. No evidence of pulmonary embolism. Normal heart size. No pericardial effusion. Mediastinum/Nodes: No enlarged mediastinal, hilar, or axillary lymph nodes. Thyroid gland, trachea, and esophagus demonstrate no significant findings. Lungs/Pleura: Lungs are clear. No pleural effusion or pneumothorax. Upper Abdomen: No acute abnormality. Musculoskeletal: No chest wall abnormality. No acute or significant osseous findings. Review of the MIP images confirms the above findings. IMPRESSION: Negative examination for pulmonary embolism. Electronically Signed   By: Valentino Saxon MD   On: 03/10/2020 09:34    Procedures Procedures (including critical care time)  Medications Ordered in ED Medications - No data to display  ED Course  I have reviewed the triage vital signs and the nursing notes.  Pertinent labs & imaging results that were available during my care of the patient were reviewed by me and considered in my medical decision making (see chart for details).    MDM Rules/Calculators/A&P                         Labs sent.   Reviewed nursing notes and prior charts for additional history.   BP is high currently, pt notes  since prolonged wait in ED, did not have her normal meds last pm.   No current headache, and indicates is breathing normally.  Currently, pts rr 14, pulse ox has remained 100% during ED stay. No cp or discomfort.   Labs reviewed/interpreted by me - hgb 10, improved from most recent prior. Pt denies any recent bleeding/blood loss. Chem normal.   Pt mentions pcp had done some recent labs, states other than hgb not being normal, unsure of other specific results but that pcp had requested pt go to ED for 'some type of test' - will try to reach pcp for clarification.  Pts pcp, Dr Delfina Redwood, contacted - he indicates ddimer done as outpt was elevated, and so had requested go to ED for r/o PE study.   On shared decision making with pt, I discussed that based on my exam, vitals (hr 68, rr 14, pulse ox 100%), and her symptoms, I felt PE was exceedingly unlikely. However, pt requests r/o PE study/CT imaging to be done in accordance with her pcp. Will get CT.   Pt notes hx utis, as 11-20 wbc will tx w keflex x 3 days.   CT reviewed/interpreted by me - no PE.      Final Clinical Impression(s) / ED Diagnoses Final diagnoses:  None    Rx / DC Orders ED Discharge Orders    None       Lajean Saver, MD 03/10/20 1025

## 2020-03-10 NOTE — Discharge Instructions (Addendum)
It was our pleasure to provide your ER care today - we hope that you feel better.  Your lab tests show a possible urine infection - take keflex as prescribed.   Get adequate rest, drink plenty of fluids, eat balanced diet.   Follow up with primary care doctor in 1 week if symptoms fail to improve/resolve.  Your blood pressure is high - continue your medication, limit salt intake, and follow up with your doctor in 1 week.   Return to ER if worse, new symptoms, new or severe pain, high fevers, chest pain, trouble breathing, or other concern.

## 2020-07-02 ENCOUNTER — Other Ambulatory Visit: Payer: Self-pay

## 2020-07-02 DIAGNOSIS — Z20822 Contact with and (suspected) exposure to covid-19: Secondary | ICD-10-CM

## 2020-07-03 LAB — NOVEL CORONAVIRUS, NAA: SARS-CoV-2, NAA: NOT DETECTED

## 2020-07-03 LAB — SARS-COV-2, NAA 2 DAY TAT

## 2020-07-04 ENCOUNTER — Telehealth: Payer: Self-pay | Admitting: *Deleted

## 2020-07-13 ENCOUNTER — Other Ambulatory Visit: Payer: No Typology Code available for payment source

## 2020-07-13 DIAGNOSIS — Z20822 Contact with and (suspected) exposure to covid-19: Secondary | ICD-10-CM

## 2020-07-14 LAB — SARS-COV-2, NAA 2 DAY TAT

## 2020-07-14 LAB — NOVEL CORONAVIRUS, NAA: SARS-CoV-2, NAA: NOT DETECTED

## 2020-08-28 DIAGNOSIS — R102 Pelvic and perineal pain: Secondary | ICD-10-CM | POA: Diagnosis not present

## 2020-08-28 DIAGNOSIS — R109 Unspecified abdominal pain: Secondary | ICD-10-CM | POA: Diagnosis not present

## 2020-09-06 DIAGNOSIS — Z01419 Encounter for gynecological examination (general) (routine) without abnormal findings: Secondary | ICD-10-CM | POA: Diagnosis not present

## 2020-09-06 DIAGNOSIS — R0789 Other chest pain: Secondary | ICD-10-CM | POA: Diagnosis not present

## 2020-09-06 DIAGNOSIS — R12 Heartburn: Secondary | ICD-10-CM | POA: Diagnosis not present

## 2020-09-06 DIAGNOSIS — N939 Abnormal uterine and vaginal bleeding, unspecified: Secondary | ICD-10-CM | POA: Diagnosis not present

## 2020-09-06 DIAGNOSIS — R109 Unspecified abdominal pain: Secondary | ICD-10-CM | POA: Diagnosis not present

## 2020-09-24 DIAGNOSIS — R102 Pelvic and perineal pain: Secondary | ICD-10-CM | POA: Diagnosis not present

## 2020-11-19 DIAGNOSIS — N939 Abnormal uterine and vaginal bleeding, unspecified: Secondary | ICD-10-CM | POA: Diagnosis not present

## 2020-11-19 DIAGNOSIS — Z3169 Encounter for other general counseling and advice on procreation: Secondary | ICD-10-CM | POA: Diagnosis not present

## 2020-11-19 DIAGNOSIS — D250 Submucous leiomyoma of uterus: Secondary | ICD-10-CM | POA: Diagnosis not present

## 2020-12-20 DIAGNOSIS — R202 Paresthesia of skin: Secondary | ICD-10-CM | POA: Diagnosis not present

## 2020-12-20 DIAGNOSIS — I10 Essential (primary) hypertension: Secondary | ICD-10-CM | POA: Diagnosis not present

## 2020-12-20 DIAGNOSIS — S90221A Contusion of right lesser toe(s) with damage to nail, initial encounter: Secondary | ICD-10-CM | POA: Diagnosis not present

## 2020-12-20 DIAGNOSIS — R21 Rash and other nonspecific skin eruption: Secondary | ICD-10-CM | POA: Diagnosis not present

## 2021-01-03 DIAGNOSIS — N898 Other specified noninflammatory disorders of vagina: Secondary | ICD-10-CM | POA: Diagnosis not present

## 2021-01-03 DIAGNOSIS — N939 Abnormal uterine and vaginal bleeding, unspecified: Secondary | ICD-10-CM | POA: Diagnosis not present

## 2021-01-03 DIAGNOSIS — D250 Submucous leiomyoma of uterus: Secondary | ICD-10-CM | POA: Diagnosis not present

## 2021-01-08 ENCOUNTER — Encounter (HOSPITAL_BASED_OUTPATIENT_CLINIC_OR_DEPARTMENT_OTHER): Payer: Self-pay | Admitting: Obstetrics and Gynecology

## 2021-01-08 ENCOUNTER — Other Ambulatory Visit: Payer: Self-pay

## 2021-01-08 DIAGNOSIS — M549 Dorsalgia, unspecified: Secondary | ICD-10-CM

## 2021-01-08 DIAGNOSIS — L309 Dermatitis, unspecified: Secondary | ICD-10-CM

## 2021-01-08 HISTORY — DX: Dorsalgia, unspecified: M54.9

## 2021-01-08 HISTORY — DX: Dermatitis, unspecified: L30.9

## 2021-01-08 NOTE — Progress Notes (Addendum)
YOU ARE SCHEDULED FOR A COVID TEST ON   01-14-2021  . THIS TEST MUST BE DONE BEFORE SURGERY. GO TO  [facility] [place] IN YOUR CAR, THIS IS A DRIVE UP TEST.     Your surgery is 01-16-2021   Report to [facility].   Call this number if you have problems the morning of surgery  :[phone].   OUR ADDRESS IS [address].  WE ARE LOCATED IN [facility].  PLEASE BRING YOUR INSURANCE CARD AND PHOTO ID DAY OF SURGERY.  ONLY ONE PERSON ALLOWED IN FACILITY WAITING AREA.                                     REMEMBER:  DO NOT EAT FOOD, CANDY GUM OR MINTS  AFTER MIDNIGHT . YOU MAY HAVE CLEAR LIQUIDS FROM MIDNIGHT UNTIL  815 AM. NO CLEAR LIQUIDS AFTER 815 AM DAY OF SURGERY.   YOU MAY  BRUSH YOUR TEETH MORNING OF SURGERY AND RINSE YOUR MOUTH OUT, NO CHEWING GUM CANDY OR MINTS.    CLEAR LIQUID DIET   Foods Allowed                                                                     Foods Excluded  Coffee and tea, regular and decaf                             liquids that you cannot  Plain Jell-O any favor except red or purple                                           see through such as: Fruit ices (not with fruit pulp)                                     milk, soups, orange juice  Iced Popsicles                                    All solid food Carbonated beverages, regular and diet                                    Cranberry, grape and apple juices Sports drinks like Gatorade Lightly seasoned clear broth or consume(fat free) Sugar, honey syrup  Sample Menu Breakfast                                Lunch  Supper Cranberry juice                    Beef broth                            Chicken broth Jell-O                                     Grape juice                           Apple juice Coffee or tea                        Jell-O                                       Popsicle                                                Coffee or tea                        Coffee or tea  _____________________________________________________________________     TAKE THESE MEDICATIONS MORNING OF SURGERY WITH A SIP OF WATER:  METOPROLOL TARTRATE, DO NOT TAKE LOSARTAN DAY OF SURGERY IT WILL BE GIVEN AFTER YOUR SURGERY.  ONE VISITOR IS ALLOWED IN WAITING ROOM ONLY DAY OF SURGERY.  NO VISITOR MAY SPEND THE NIGHT.  VISITOR ARE ALLOWED TO STAY UNTIL 800 PM.                                    DO NOT WEAR JEWERLY, MAKE UP. DO NOT WEAR LOTIONS, POWDERS, PERFUMES OR NAIL POLISH. DO NOT SHAVE FOR 48 HOURS PRIOR TO DAY OF SURGERY. MEN MAY SHAVE FACE AND NECK. CONTACTS, GLASSES, OR DENTURES MAY NOT BE WORN TO SURGERY.                                    [facility] IS NOT RESPONSIBLE  FOR ANY BELONGINGS.                                                                    Marland Kitchen           [facility] - Preparing for Surgery Before surgery, you can play an important role.  Because skin is not sterile, your skin needs to be as free of germs as possible.  You can reduce the number of germs on your skin by washing with CHG (chlorahexidine gluconate) soap before surgery.  CHG is an antiseptic cleaner which kills germs and bonds with the skin to continue killing germs even after washing. Please DO NOT use if  you have an allergy to CHG or antibacterial soaps.  If your skin becomes reddened/irritated stop using the CHG and inform your nurse when you arrive at Short Stay. Do not shave (including legs and underarms) for at least 48 hours prior to the first CHG shower.  You may shave your face/neck. Please follow these instructions carefully:  1.  Shower with CHG Soap the night before surgery and the  morning of Surgery.  2.  If you choose to wash your hair, wash your hair first as usual with your  normal  shampoo.  3.  After you shampoo, rinse your hair and body thoroughly to  remove the  shampoo.                            4.  Use CHG as you would any other liquid soap.  You can apply chg directly  to the skin and wash                      Gently with a scrungie or clean washcloth.  5.  Apply the CHG Soap to your body ONLY FROM THE NECK DOWN.   Do not use on face/ open                           Wound or open sores. Avoid contact with eyes, ears mouth and genitals (private parts).                       Wash face,  Genitals (private parts) with your normal soap.             6.  Wash thoroughly, paying special attention to the area where your surgery  will be performed.  7.  Thoroughly rinse your body with warm water from the neck down.  8.  DO NOT shower/wash with your normal soap after using and rinsing off  the CHG Soap.                9.  Pat yourself dry with a clean towel.            10.  Wear clean pajamas.            11.  Place clean sheets on your bed the night of your first shower and do not  sleep with pets. Day of Surgery : Do not apply any lotions/deodorants the morning of surgery.  Please wear clean clothes to the hospital/surgery center.  FAILURE TO FOLLOW THESE INSTRUCTIONS MAY RESULT IN THE CANCELLATION OF YOUR SURGERY PATIENT SIGNATURE_________________________________  NURSE SIGNATURE__________________________________  ________________________________________________________________________                                                        QUESTIONS Hansel Feinstein PRE OP NURSE PHONE [phone].

## 2021-01-08 NOTE — Progress Notes (Signed)
Spoke w/ via phone for pre-op interview---pt Lab needs dos---- urine poct              Lab results------lab appt 01-14-2021 '@1415'$  for cbc bmp t & s, ekg 03-09-2020 epic, lov cardiology dr Delanna Notice 11-02-2015 f/u prn, ett 03-14-2011 epic COVID test ----01-14-2021 (overnight stay) NPO after MN NO Solid Food.  Clear liquids from MN until---815 am then npo Med rec completed Medications to take morning of surgery -----certrizine, metoprolol Diabetic medication -----n/a Patient instructed no nail polish to be worn day of surgery Patient instructed to bring photo id and insurance card day of surgery Patient aware to have Driver (ride ) / caregiver   pauline mother  for 24 hours after surgery  Patient Special Instructions -----pt given overnight stay instructions Pre-Op special Istructions -----none Patient verbalized understanding of instructions that were given at this phone interview. Patient denies shortness of breath, chest pain, fever, cough at this phone interview.

## 2021-01-10 ENCOUNTER — Other Ambulatory Visit: Payer: Self-pay | Admitting: Obstetrics and Gynecology

## 2021-01-10 NOTE — H&P (Signed)
Reason for Appointment   1. 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 Does patient report new onset of COVID symptoms? No. Has patient or close contact tested positive for COVID-19? No , not in the past 2 weeks.  General:          35 yo presents for a preop visit for robotic-assisted laparoscopic hysterectomy.         Pt is scheduled for a robotic-assisted laparoscopic hysterectomy on August 10th at 11 am for management of fibroids and AUB.         Dr. Otilio Carpen removed a fibroid measuring 2.9 cm in 01/2020. However, it is possible that new fibroid grew back after Myosure procedure. Pelvic US was performed on 09/24/220 showing uterus measuring 9.3 x 4.4 x 5.1 cm. Two fibroids were noted. The largest is 2.56 cm. The submucosal fibroid that was 2.6 cm was stable in size from previous US on 01/05/2020. Fibroids appeared to be majority within the endometrial cavity.         Pt was last seen 11/19/2020:         Pt reported that she had cramping here and there when she has her period. Pt claims she spots about 20 days out of 60 days. Pt stated she is considering a hysterectomy. Pt is interested in definitive therapy. she reports no desire for children in the future. If she were to change her mind she plans to look into oocyte preservation and using a surrogate. She is a candidate for robotic-assisted laparoscopic hysterectomy. Discussed risks of hysterectomy including but not limited to infection, bleeding, conversion to larger incision, damage to her bowel, bladder, or ureters, with the need for further surgery.         TODAY 01/03/2021:         Discussed risk of hysterectomy and post-surgery protocols.         Pt asked about birth control. Advised to take it up until the night before her surgery.         Pt reported concerns about bleeding afterward. Advised that she may experience spotting for 2-4 weeks post-surgery.          Pt reports she has been experiencing vaginal discharge. Denies itch, burn, or odor.    Current Medications  Taking   Acyclovir 400 MG Tablet 1 tablet by mouth once a day  Clobetasol Propionate [id number] % Cream 1 application Externally Twice a day  Metoprolol Tartrate 25 MG Tablet Take 1 tablet by mouth twice daily   Losartan Potassium 25 MG Tablet TAKE 1 TABLET BY MOUTH EVERY DAY   Junel Fe 24(Norethin Ace-Eth Estrad-FE) 1-20 MG-MCG(24) Tablet 1 tablet Orally Once a day  Zyrtec 1 tab Oral   MVI   Medication List reviewed and reconciled with the patient   Past Medical History  Hypertension, benign.   Kidney stones.   Fever blisters/hsv.    Surgical History  cystoscopy for kidney stone   hysteroscopic uterine fibroid removed 2021   Family History  Father: alive, hypertension  Mother: alive, heart disease, CABG at age 4, diagnosed with Hypertension  Paternal Grand Father: deceased  Paternal Grand Mother: deceased  Maternal Grand Father: deceased  Maternal Grand Mother: alive  Sister 1: alive, skin cancer, multiple sclerosis  Sister 2: alive  2 sister(s) - healthy.   denies any GYN family cancer hx, No Family  History of Colon Cancer.   Social History  General:  Tobacco use cigarettes: Never smoked, Tobacco history last updated 01/03/2021, Additional Findings: Tobacco Non-User Non-smoker for personal reasons. no EXPOSURE TO PASSIVE SMOKE. Alcohol: yes, Rare. Caffeine: yes, soda, rare. no Recreational drug use. no Exercise. Marital Status: single. Children: none. 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    Gyn History  Sexual activity not currently sexually active.  Periods : irregular.  LMP 11/2020.  Birth control Junel .  Last pap smear date 12/08/19- negative.  Last mammogram date 05/2017 lump in right breast since teen.  Abnormal pap smear 12/20/13 - WNL HPV + Colpo ~ 12/16 = WNL per pt. Done in [place]..  STD HSV I, Gonorrhea.   Menarche 40.    OB History  Never been pregnant per patient.    Allergies  Metronidazole  Elidel   Hospitalization/Major Diagnostic Procedure  kidney stones    Review of Systems  CONSTITUTIONAL:  Chills No. Fatigue No. Fever No. Night sweats No. Recent travel outside [place] No. Sweats No. Weight change No.  OPHTHALMOLOGY:  Blurring of vision no. Change in vision no. Double vision no.  ENT:  Dizziness no. Nose bleeds no. Sore throat no. Teeth pain no.  ALLERGY:  Hives no.  CARDIOLOGY:  Chest pain no. High blood pressure no. Irregular heart beat no. Leg edema no. Palpitations no.  RESPIRATORY:  Shortness of breath no. Cough no. Wheezing no.  UROLOGY:  Pain with urination no. Urinary urgency no. Urinary frequency no. Urinary incontinence no. Difficulty urinating No. Blood in urine No.  GASTROENTEROLOGY:  Abdominal pain no. Appetite change no. Bloating/belching no. Blood in stool or on toilet paper no. Change in bowel movements no. Constipation no. Diarrhea no. Difficulty swallowing no. Nausea no.  FEMALE REPRODUCTIVE:  Vulvar pain no. Vulvar rash no. Abnormal vaginal bleeding yes. Breast pain no. Nipple discharge no. Pain with intercourse no. Pelvic pain no. Unusual vaginal discharge no. Vaginal itching no.  MUSCULOSKELETAL:  Muscle aches no.  NEUROLOGY:  Headache no. Tingling/numbness no. Weakness no.  PSYCHOLOGY:  Depression no. Anxiety no. Nervousness no. Sleep disturbances no. Suicidal ideation no .  ENDOCRINOLOGY:  Excessive thirst no. Excessive urination no. Hair loss no. Heat or cold intolerance no.  HEMATOLOGY/LYMPH:  Abnormal bleeding no. Easy bruising no. Swollen glands no.  DERMATOLOGY:  New/changing skin lesion no. Rash no. Sores no.  Negative except as stated in HPI.   Vital Signs  Wt 130.2, Wt change 2 lb, Ht 63.25, BMI 22.88, Pulse sitting 72, BP sitting 108/72.     Examination  General Examination:       CONSTITUTIONAL: alert, oriented, NAD . SKIN:   moist, warm. EYES:  Conjunctiva clear. LUNGS:  good I:E efffort noted, clear to auscultation bilaterally. HEART:  regular rate and rhythm. ABDOMEN: soft, non-tender/non-distended, bowel sounds present . FEMALE GENITOURINARY: normal external genitalia, labia - unremarkable, vagina - pink moist mucosa, no lesions or abnormal discharge, cervix - no discharge or lesions or CMT, adnexa - no masses or tenderness, uterus - nontender, 10 week size uterus. Marland Kitchen PSYCH:  affect normal, good eye contact.       Physical Examination     Pt aware of scribe services today.     Assessments     1. Fibroids, submucosal - D25.0 (Primary)   2. Abnormal uterine bleeding - N93.9   3. Vaginal discharge - N89.8     Treatment   1. Fibroids, submucosal   Notes: Discussed risk  of hysterectomy with pt. Pt advised she will stay overnight, or 2 days if conversion to larger incision. She is advised that in order to be discharged from hospital, she will need to be able to ambulate, urinate, flatulate, tolerate food by mouth, and take pain medication by mouth. Discussed risks of hysterectomy including but not limited to infection, bleeding, conversion to larger incision, damage to her bowel, bladder, or ureters, with the need for further surgery. Discussed risk of blood transfusion and risk of HIV or hep B&C with blood transfusion. Pt is aware of risks and desires blood transfusion if needed. Pt advised to avoid NSAIDs (Aspirin, Aleve, Advil, Ibuprofen, Motrin) from now until surgery given risk of bleeding during surgery. She may take Tylenol for pain management. She is advised to avoid eating or drinking starting midnight prior to surgery. Discussed post-surgery avoidance of driving for 1 week and avoidance of lifting weight greater than 10 lbs or intercourse for 6-8 weeks after procedure. Follow up in 4 weeks for 2 wk post-op visit.      2. Abnormal uterine bleeding   Notes: Discussed risk of hysterectomy with pt. Pt advised  she will stay overnight, or 2 days if conversion to larger incision. She is advised that in order to be discharged from hospital, she will need to be able to ambulate, urinate, flatulate, tolerate food by mouth, and take pain medication by mouth. Discussed risks of hysterectomy including but not limited to infection, bleeding, conversion to larger incision, damage to her bowel, bladder, or ureters, with the need for further surgery. Discussed risk of blood transfusion and risk of HIV or hep B&C (1 out of 2 million and 1 out of 200,000, respectively) with blood transfusion. Pt is aware of risks and desires blood transfusion if needed. Pt advised to avoid NSAIDs (Aspirin, Aleve, Advil, Ibuprofen, Motrin) from now until surgery given risk of bleeding during surgery. She may take Tylenol for pain management. She is advised to avoid eating or drinking starting midnight prior to surgery. Discussed post-surgery avoidance of driving for 1 week and avoidance of lifting weight greater than 10 lbs or intercourse for 6-8 weeks after procedure. Follow up in 4 weeks for 2 wk post-op visit.Marland Kitchen      3. Vaginal discharge        LAB: [facility] Notes: Pt reports she has been experiencing vaginal discharge. Denies itch, burn, or odor.          Procedures  Scribe Documentation:         Attestation:  I personally scribed for Dr. Landry Mellow on the date of this appointment. Electronically signed by scribe , Carolyne Fiscal 01/03/2021 04:21:32 PM > .              Labs         Lab: [facility] no infection seen         Value Reference Range       WBCS Greatly increased A Normal -         CLUE CELLS None seen  None seen -         TRICHOMONAS None Seen  None Seen -         YEAST None seen  None seen -                  Adelina Collard 01/03/2021 06:40:07 PM > no bv/ trich or yeast

## 2021-01-10 NOTE — H&P (Deleted)
  The note originally documented on this encounter has been moved the the encounter in which it belongs.  

## 2021-01-14 ENCOUNTER — Other Ambulatory Visit: Payer: Self-pay

## 2021-01-14 ENCOUNTER — Other Ambulatory Visit: Payer: Self-pay | Admitting: Obstetrics and Gynecology

## 2021-01-14 ENCOUNTER — Encounter (HOSPITAL_COMMUNITY)
Admission: RE | Admit: 2021-01-14 | Discharge: 2021-01-14 | Disposition: A | Discharge status: Home / Self Care | Payer: 59 | Source: Ambulatory Visit | Attending: Obstetrics and Gynecology | Admitting: Obstetrics and Gynecology

## 2021-01-14 DIAGNOSIS — Z01812 Encounter for preprocedural laboratory examination: Secondary | ICD-10-CM | POA: Insufficient documentation

## 2021-01-14 DIAGNOSIS — Z20822 Contact with and (suspected) exposure to covid-19: Secondary | ICD-10-CM | POA: Diagnosis not present

## 2021-01-14 LAB — BASIC METABOLIC PANEL
Anion gap: 6 (ref 5–15)
BUN: 12 mg/dL (ref 6–20)
CO2: 26 mmol/L (ref 22–32)
Calcium: 9.1 mg/dL (ref 8.9–10.3)
Chloride: 105 mmol/L (ref 98–111)
Creatinine, Ser: 0.89 mg/dL (ref 0.44–1.00)
GFR, Estimated: >60 mL/min (ref >60)
Glucose, Bld: 83 mg/dL (ref 70–99)
Potassium: 4.5 mmol/L (ref 3.5–5.1)
Sodium: 137 mmol/L (ref 135–145)

## 2021-01-14 LAB — CBC
HCT: 36.9 % (ref 36.0–46.0)
Hemoglobin: 12.6 g/dL (ref 12.0–15.0)
MCH: 33 pg (ref 26.0–34.0)
MCHC: 34.1 g/dL (ref 30.0–36.0)
MCV: 96.6 fL (ref 80.0–100.0)
Platelets: 333 10*3/uL (ref 150–400)
RBC: 3.82 MIL/uL — ABNORMAL LOW (ref 3.87–5.11)
RDW: 13.2 % (ref 11.5–15.5)
WBC: 5.7 10*3/uL (ref 4.0–10.5)
nRBC: 0 % (ref 0.0–0.2)

## 2021-01-15 LAB — SARS CORONAVIRUS 2 (TAT 6-24 HRS): SARS Coronavirus 2: NEGATIVE

## 2021-01-15 MED ORDER — SODIUM CHLORIDE 0.9 % IV SOLN
2.0000 g | INTRAVENOUS | Status: AC
  Administered 2021-01-16: 2 g via INTRAVENOUS
  Filled 2021-01-15: qty 2

## 2021-01-16 ENCOUNTER — Observation Stay (HOSPITAL_BASED_OUTPATIENT_CLINIC_OR_DEPARTMENT_OTHER): Payer: 59 | Admitting: Anesthesiology

## 2021-01-16 ENCOUNTER — Encounter (HOSPITAL_BASED_OUTPATIENT_CLINIC_OR_DEPARTMENT_OTHER): Payer: Self-pay | Admitting: Obstetrics and Gynecology

## 2021-01-16 ENCOUNTER — Encounter (HOSPITAL_BASED_OUTPATIENT_CLINIC_OR_DEPARTMENT_OTHER)
Admission: RE | Disposition: A | Discharge status: Home / Self Care | Payer: Self-pay | Source: Home / Self Care | Attending: Obstetrics and Gynecology

## 2021-01-16 ENCOUNTER — Other Ambulatory Visit: Payer: Self-pay

## 2021-01-16 ENCOUNTER — Observation Stay (HOSPITAL_BASED_OUTPATIENT_CLINIC_OR_DEPARTMENT_OTHER)
Admission: RE | Admit: 2021-01-16 | Discharge: 2021-01-17 | Disposition: A | Discharge status: Home / Self Care | Payer: 59 | Attending: Obstetrics and Gynecology | Admitting: Obstetrics and Gynecology

## 2021-01-16 DIAGNOSIS — D252 Subserosal leiomyoma of uterus: Secondary | ICD-10-CM | POA: Diagnosis not present

## 2021-01-16 DIAGNOSIS — N888 Other specified noninflammatory disorders of cervix uteri: Secondary | ICD-10-CM | POA: Diagnosis not present

## 2021-01-16 DIAGNOSIS — I10 Essential (primary) hypertension: Secondary | ICD-10-CM | POA: Insufficient documentation

## 2021-01-16 DIAGNOSIS — Z90710 Acquired absence of both cervix and uterus: Secondary | ICD-10-CM | POA: Diagnosis present

## 2021-01-16 DIAGNOSIS — D250 Submucous leiomyoma of uterus: Secondary | ICD-10-CM | POA: Diagnosis not present

## 2021-01-16 DIAGNOSIS — N939 Abnormal uterine and vaginal bleeding, unspecified: Secondary | ICD-10-CM | POA: Diagnosis not present

## 2021-01-16 DIAGNOSIS — N72 Inflammatory disease of cervix uteri: Secondary | ICD-10-CM | POA: Diagnosis not present

## 2021-01-16 DIAGNOSIS — Z79899 Other long term (current) drug therapy: Secondary | ICD-10-CM | POA: Insufficient documentation

## 2021-01-16 DIAGNOSIS — D251 Intramural leiomyoma of uterus: Secondary | ICD-10-CM | POA: Diagnosis not present

## 2021-01-16 DIAGNOSIS — D630 Anemia in neoplastic disease: Secondary | ICD-10-CM | POA: Diagnosis not present

## 2021-01-16 HISTORY — DX: Abnormal uterine and vaginal bleeding, unspecified: N93.9

## 2021-01-16 HISTORY — DX: Personal history of urinary calculi: Z87.442

## 2021-01-16 HISTORY — DX: Gastro-esophageal reflux disease without esophagitis: K21.9

## 2021-01-16 HISTORY — DX: Headache, unspecified: R51.9

## 2021-01-16 HISTORY — PX: ROBOTIC ASSISTED LAPAROSCOPIC HYSTERECTOMY AND SALPINGECTOMY: SHX6379

## 2021-01-16 LAB — TYPE AND SCREEN
ABO/RH(D): A POS
Antibody Screen: NEGATIVE

## 2021-01-16 LAB — POCT PREGNANCY, URINE: Preg Test, Ur: NEGATIVE

## 2021-01-16 SURGERY — XI ROBOTIC ASSISTED LAPAROSCOPIC HYSTERECTOMY AND SALPINGECTOMY
Anesthesia: General | Site: Abdomen

## 2021-01-16 MED ORDER — KETOROLAC TROMETHAMINE 30 MG/ML IJ SOLN
INTRAMUSCULAR | Status: AC
  Filled 2021-01-16: qty 1

## 2021-01-16 MED ORDER — ACETAMINOPHEN 500 MG PO TABS
1000.0000 mg | ORAL_TABLET | Freq: Four times a day (QID) | ORAL | Status: DC
  Administered 2021-01-16 – 2021-01-17 (×3): 1000 mg via ORAL

## 2021-01-16 MED ORDER — LIDOCAINE HCL (PF) 2 % IJ SOLN
INTRAMUSCULAR | Status: AC
  Filled 2021-01-16: qty 15

## 2021-01-16 MED ORDER — LOSARTAN POTASSIUM 25 MG PO TABS
25.0000 mg | ORAL_TABLET | Freq: Every day | ORAL | Status: DC
  Administered 2021-01-16: 25 mg via ORAL
  Filled 2021-01-16: qty 1

## 2021-01-16 MED ORDER — POVIDONE-IODINE 10 % EX SWAB: 2.0000 "application " | Freq: Once | CUTANEOUS | Status: DC

## 2021-01-16 MED ORDER — PROPOFOL 10 MG/ML IV BOLUS
INTRAVENOUS | Status: DC | PRN
  Administered 2021-01-16: 120 mg via INTRAVENOUS

## 2021-01-16 MED ORDER — ACETAMINOPHEN 500 MG PO TABS
ORAL_TABLET | ORAL | Status: AC
  Filled 2021-01-16: qty 2

## 2021-01-16 MED ORDER — CELECOXIB 200 MG PO CAPS
ORAL_CAPSULE | ORAL | Status: AC
  Filled 2021-01-16: qty 2

## 2021-01-16 MED ORDER — SENNA 8.6 MG PO TABS
1.0000 | ORAL_TABLET | Freq: Two times a day (BID) | ORAL | Status: DC
  Administered 2021-01-16: 8.6 mg via ORAL

## 2021-01-16 MED ORDER — PHENYLEPHRINE 40 MCG/ML (10ML) SYRINGE FOR IV PUSH (FOR BLOOD PRESSURE SUPPORT)
PREFILLED_SYRINGE | INTRAVENOUS | Status: DC | PRN
  Administered 2021-01-16: 40 ug via INTRAVENOUS

## 2021-01-16 MED ORDER — SUGAMMADEX SODIUM 200 MG/2ML IV SOLN
INTRAVENOUS | Status: DC | PRN
  Administered 2021-01-16: 120 mg via INTRAVENOUS

## 2021-01-16 MED ORDER — LIDOCAINE 2% (20 MG/ML) 5 ML SYRINGE
INTRAMUSCULAR | Status: DC | PRN
  Administered 2021-01-16: 50 mg via INTRAVENOUS

## 2021-01-16 MED ORDER — KETOROLAC TROMETHAMINE 30 MG/ML IJ SOLN
30.0000 mg | Freq: Once | INTRAMUSCULAR | Status: AC
  Administered 2021-01-16: 30 mg via INTRAVENOUS

## 2021-01-16 MED ORDER — ONDANSETRON HCL 4 MG/2ML IJ SOLN
4.0000 mg | Freq: Four times a day (QID) | INTRAMUSCULAR | Status: DC | PRN
  Administered 2021-01-16: 4 mg via INTRAVENOUS

## 2021-01-16 MED ORDER — FENTANYL CITRATE (PF) 100 MCG/2ML IJ SOLN
INTRAMUSCULAR | Status: AC
  Filled 2021-01-16: qty 2

## 2021-01-16 MED ORDER — STERILE WATER FOR IRRIGATION IR SOLN
Status: DC | PRN
  Administered 2021-01-16: 1000 mL

## 2021-01-16 MED ORDER — ROCURONIUM BROMIDE 10 MG/ML (PF) SYRINGE
PREFILLED_SYRINGE | INTRAVENOUS | Status: AC
  Filled 2021-01-16: qty 10

## 2021-01-16 MED ORDER — PANTOPRAZOLE SODIUM 40 MG PO TBEC
DELAYED_RELEASE_TABLET | ORAL | Status: AC
  Filled 2021-01-16: qty 1

## 2021-01-16 MED ORDER — HEMOSTATIC AGENTS (NO CHARGE) OPTIME
TOPICAL | Status: DC | PRN
Start: 2021-01-16 — End: 2021-01-16
  Administered 2021-01-16: 1 via TOPICAL

## 2021-01-16 MED ORDER — ONDANSETRON HCL 4 MG/2ML IJ SOLN
INTRAMUSCULAR | Status: AC
  Filled 2021-01-16: qty 2

## 2021-01-16 MED ORDER — PANTOPRAZOLE SODIUM 40 MG PO TBEC
40.0000 mg | DELAYED_RELEASE_TABLET | Freq: Every day | ORAL | Status: DC
  Administered 2021-01-16: 40 mg via ORAL

## 2021-01-16 MED ORDER — CELECOXIB 200 MG PO CAPS
400.0000 mg | ORAL_CAPSULE | ORAL | Status: AC
  Administered 2021-01-16: 400 mg via ORAL

## 2021-01-16 MED ORDER — LORATADINE 10 MG PO TABS
10.0000 mg | ORAL_TABLET | Freq: Every day | ORAL | Status: DC
  Administered 2021-01-16: 10 mg via ORAL

## 2021-01-16 MED ORDER — ONDANSETRON HCL 4 MG PO TABS: 4.0000 mg | ORAL_TABLET | Freq: Four times a day (QID) | ORAL | Status: DC | PRN

## 2021-01-16 MED ORDER — LORATADINE 10 MG PO TABS
ORAL_TABLET | ORAL | Status: AC
  Filled 2021-01-16: qty 1

## 2021-01-16 MED ORDER — IBUPROFEN 200 MG PO TABS
600.0000 mg | ORAL_TABLET | Freq: Four times a day (QID) | ORAL | Status: DC
  Administered 2021-01-16 – 2021-01-17 (×2): 600 mg via ORAL

## 2021-01-16 MED ORDER — OXYCODONE HCL 5 MG PO TABS
ORAL_TABLET | ORAL | Status: AC
  Filled 2021-01-16: qty 1

## 2021-01-16 MED ORDER — SENNA 8.6 MG PO TABS
ORAL_TABLET | ORAL | Status: AC
  Filled 2021-01-16: qty 1

## 2021-01-16 MED ORDER — ALUM & MAG HYDROXIDE-SIMETH 200-200-20 MG/5ML PO SUSP: 30.0000 mL | ORAL | Status: DC | PRN

## 2021-01-16 MED ORDER — DEXAMETHASONE SODIUM PHOSPHATE 10 MG/ML IJ SOLN
INTRAMUSCULAR | Status: DC | PRN
  Administered 2021-01-16: 5 mg via INTRAVENOUS

## 2021-01-16 MED ORDER — MIDAZOLAM HCL 2 MG/2ML IJ SOLN
INTRAMUSCULAR | Status: AC
  Filled 2021-01-16: qty 2

## 2021-01-16 MED ORDER — SODIUM CHLORIDE (PF) 0.9 % IJ SOLN
INTRAMUSCULAR | Status: AC
  Filled 2021-01-16: qty 10

## 2021-01-16 MED ORDER — SODIUM CHLORIDE 0.9 % IR SOLN
Status: DC | PRN
  Administered 2021-01-16: 20 mL

## 2021-01-16 MED ORDER — ZOLPIDEM TARTRATE 5 MG PO TABS: 5.0000 mg | ORAL_TABLET | Freq: Every evening | ORAL | Status: DC | PRN

## 2021-01-16 MED ORDER — SCOPOLAMINE 1 MG/3DAYS TD PT72
1.0000 | MEDICATED_PATCH | TRANSDERMAL | Status: DC
  Administered 2021-01-16: 1.5 mg via TRANSDERMAL

## 2021-01-16 MED ORDER — APREPITANT 40 MG PO CAPS
40.0000 mg | ORAL_CAPSULE | Freq: Once | ORAL | Status: AC
  Administered 2021-01-16: 40 mg via ORAL

## 2021-01-16 MED ORDER — SODIUM CHLORIDE 0.9 % IV SOLN
INTRAVENOUS | Status: DC | PRN
  Administered 2021-01-16: 120 mL

## 2021-01-16 MED ORDER — METHYLENE BLUE 1 % INJ SOLN
INTRAMUSCULAR | Status: DC | PRN
  Administered 2021-01-16: 5 mL

## 2021-01-16 MED ORDER — WHITE PETROLATUM EX OINT
TOPICAL_OINTMENT | CUTANEOUS | Status: AC
  Filled 2021-01-16: qty 5

## 2021-01-16 MED ORDER — ROCURONIUM BROMIDE 10 MG/ML (PF) SYRINGE
PREFILLED_SYRINGE | INTRAVENOUS | Status: DC | PRN
  Administered 2021-01-16: 50 mg via INTRAVENOUS
  Administered 2021-01-16: 20 mg via INTRAVENOUS

## 2021-01-16 MED ORDER — KETAMINE HCL 50 MG/5ML IJ SOSY
PREFILLED_SYRINGE | INTRAMUSCULAR | Status: AC
  Filled 2021-01-16: qty 5

## 2021-01-16 MED ORDER — LACTATED RINGERS IV SOLN: INTRAVENOUS | Status: DC

## 2021-01-16 MED ORDER — PHENYLEPHRINE 40 MCG/ML (10ML) SYRINGE FOR IV PUSH (FOR BLOOD PRESSURE SUPPORT)
PREFILLED_SYRINGE | INTRAVENOUS | Status: AC
  Filled 2021-01-16: qty 10

## 2021-01-16 MED ORDER — LIDOCAINE 2% (20 MG/ML) 5 ML SYRINGE
INTRAMUSCULAR | Status: DC | PRN
  Administered 2021-01-16: 1.5 mg/kg/h via INTRAVENOUS

## 2021-01-16 MED ORDER — LABETALOL HCL 5 MG/ML IV SOLN
INTRAVENOUS | Status: AC
  Filled 2021-01-16: qty 4

## 2021-01-16 MED ORDER — METOPROLOL TARTRATE 25 MG PO TABS
25.0000 mg | ORAL_TABLET | Freq: Two times a day (BID) | ORAL | Status: DC
  Administered 2021-01-16: 25 mg via ORAL

## 2021-01-16 MED ORDER — DEXAMETHASONE SODIUM PHOSPHATE 10 MG/ML IJ SOLN
INTRAMUSCULAR | Status: AC
  Filled 2021-01-16: qty 1

## 2021-01-16 MED ORDER — HYDROMORPHONE HCL 1 MG/ML IJ SOLN: 0.2000 mg | INTRAMUSCULAR | Status: DC | PRN

## 2021-01-16 MED ORDER — LABETALOL HCL 5 MG/ML IV SOLN
10.0000 mg | Freq: Once | INTRAVENOUS | Status: AC
  Administered 2021-01-16: 10 mg via INTRAVENOUS

## 2021-01-16 MED ORDER — PROPOFOL 10 MG/ML IV BOLUS
INTRAVENOUS | Status: AC
  Filled 2021-01-16: qty 20

## 2021-01-16 MED ORDER — ACETAMINOPHEN 500 MG PO TABS
1000.0000 mg | ORAL_TABLET | ORAL | Status: AC
  Administered 2021-01-16: 1000 mg via ORAL

## 2021-01-16 MED ORDER — OXYCODONE HCL 5 MG PO TABS
5.0000 mg | ORAL_TABLET | ORAL | Status: DC | PRN
  Administered 2021-01-16 (×2): 5 mg via ORAL
  Administered 2021-01-16 – 2021-01-17 (×2): 10 mg via ORAL
  Administered 2021-01-17: 5 mg via ORAL

## 2021-01-16 MED ORDER — MIDAZOLAM HCL 2 MG/2ML IJ SOLN
INTRAMUSCULAR | Status: DC | PRN
  Administered 2021-01-16: 2 mg via INTRAVENOUS

## 2021-01-16 MED ORDER — FENTANYL CITRATE (PF) 100 MCG/2ML IJ SOLN
INTRAMUSCULAR | Status: DC | PRN
  Administered 2021-01-16: 100 ug via INTRAVENOUS

## 2021-01-16 MED ORDER — FENTANYL CITRATE (PF) 100 MCG/2ML IJ SOLN: 25.0000 ug | INTRAMUSCULAR | Status: DC | PRN

## 2021-01-16 MED ORDER — ONDANSETRON HCL 4 MG/2ML IJ SOLN
INTRAMUSCULAR | Status: DC | PRN
  Administered 2021-01-16: 4 mg via INTRAVENOUS

## 2021-01-16 MED ORDER — SODIUM CHLORIDE 0.9 % IR SOLN
Status: DC | PRN
  Administered 2021-01-16: 500 mL

## 2021-01-16 MED ORDER — OXYCODONE HCL 5 MG PO TABS
ORAL_TABLET | ORAL | Status: AC
  Filled 2021-01-16: qty 2

## 2021-01-16 MED ORDER — ACYCLOVIR 400 MG PO TABS
400.0000 mg | ORAL_TABLET | Freq: Every day | ORAL | Status: DC
  Administered 2021-01-16: 400 mg via ORAL
  Filled 2021-01-16: qty 1

## 2021-01-16 MED ORDER — IBUPROFEN 200 MG PO TABS
ORAL_TABLET | ORAL | Status: AC
  Filled 2021-01-16: qty 3

## 2021-01-16 MED ORDER — KETAMINE HCL 10 MG/ML IJ SOLN
INTRAMUSCULAR | Status: DC | PRN
  Administered 2021-01-16: 25 mg via INTRAVENOUS

## 2021-01-16 MED ORDER — SIMETHICONE 80 MG PO CHEW: 80.0000 mg | CHEWABLE_TABLET | Freq: Four times a day (QID) | ORAL | Status: DC | PRN

## 2021-01-16 MED ORDER — APREPITANT 40 MG PO CAPS
ORAL_CAPSULE | ORAL | Status: AC
  Filled 2021-01-16: qty 1

## 2021-01-16 MED ORDER — METOPROLOL TARTRATE 25 MG PO TABS
ORAL_TABLET | ORAL | Status: AC
  Filled 2021-01-16: qty 1

## 2021-01-16 MED ORDER — MENTHOL 3 MG MT LOZG: 1.0000 | LOZENGE | OROMUCOSAL | Status: DC | PRN

## 2021-01-16 MED ORDER — SCOPOLAMINE 1 MG/3DAYS TD PT72
MEDICATED_PATCH | TRANSDERMAL | Status: AC
  Filled 2021-01-16: qty 1

## 2021-01-16 SURGICAL SUPPLY — 70 items
ADH SKN CLS APL DERMABOND .7 (GAUZE/BANDAGES/DRESSINGS) ×1
APL SRG 38 LTWT LNG FL B (MISCELLANEOUS) ×1
APPLICATOR ARISTA FLEXITIP XL (MISCELLANEOUS) ×2 IMPLANT
BARRIER ADHS 3X4 INTERCEED (GAUZE/BANDAGES/DRESSINGS) IMPLANT
BRR ADH 4X3 ABS CNTRL BYND (GAUZE/BANDAGES/DRESSINGS)
CATH FOLEY 3WAY  5CC 16FR (CATHETERS) ×2
CATH FOLEY 3WAY 5CC 16FR (CATHETERS) ×1 IMPLANT
CELLS DAT CNTRL 66122 CELL SVR (MISCELLANEOUS) IMPLANT
COVER BACK TABLE 60X90IN (DRAPES) ×2 IMPLANT
COVER TIP SHEARS 8 DVNC (MISCELLANEOUS) ×1 IMPLANT
COVER TIP SHEARS 8MM DA VINCI (MISCELLANEOUS) ×2
DECANTER SPIKE VIAL GLASS SM (MISCELLANEOUS) ×2 IMPLANT
DEFOGGER SCOPE WARMER CLEARIFY (MISCELLANEOUS) ×2 IMPLANT
DERMABOND ADVANCED (GAUZE/BANDAGES/DRESSINGS) ×1
DERMABOND ADVANCED .7 DNX12 (GAUZE/BANDAGES/DRESSINGS) ×1 IMPLANT
DILATOR CANAL MILEX (MISCELLANEOUS) ×2 IMPLANT
DRAPE ARM DVNC X/XI (DISPOSABLE) ×4 IMPLANT
DRAPE COLUMN DVNC XI (DISPOSABLE) ×1 IMPLANT
DRAPE DA VINCI XI ARM (DISPOSABLE) ×8
DRAPE DA VINCI XI COLUMN (DISPOSABLE) ×2
DRAPE UTILITY 15X26 TOWEL STRL (DRAPES) ×2 IMPLANT
DURAPREP 26ML APPLICATOR (WOUND CARE) ×2 IMPLANT
ELECT REM PT RETURN 9FT ADLT (ELECTROSURGICAL) ×2
ELECTRODE REM PT RTRN 9FT ADLT (ELECTROSURGICAL) ×1 IMPLANT
GAUZE 4X4 16PLY ~~LOC~~+RFID DBL (SPONGE) ×4 IMPLANT
GLOVE SURG ENC TEXT LTX SZ6.5 (GLOVE) ×6 IMPLANT
GLOVE SURG UNDER POLY LF SZ6.5 (GLOVE) ×12 IMPLANT
HEMOSTAT ARISTA ABSORB 3G PWDR (HEMOSTASIS) ×2 IMPLANT
HIBICLENS CHG 4% 4OZ BTL (MISCELLANEOUS) ×2 IMPLANT
HOLDER FOLEY CATH W/STRAP (MISCELLANEOUS) IMPLANT
IRRIG SUCT STRYKERFLOW 2 WTIP (MISCELLANEOUS) ×2
IRRIGATION SUCT STRKRFLW 2 WTP (MISCELLANEOUS) ×1 IMPLANT
KIT TURNOVER CYSTO (KITS) ×2 IMPLANT
LEGGING LITHOTOMY PAIR STRL (DRAPES) ×2 IMPLANT
OBTURATOR OPTICAL STANDARD 8MM (TROCAR)
OBTURATOR OPTICAL STND 8 DVNC (TROCAR)
OBTURATOR OPTICALSTD 8 DVNC (TROCAR) IMPLANT
OCCLUDER COLPOPNEUMO (BALLOONS) ×2 IMPLANT
PACK ROBOT WH (CUSTOM PROCEDURE TRAY) ×2 IMPLANT
PACK ROBOTIC GOWN (GOWN DISPOSABLE) ×2 IMPLANT
PACK TRENDGUARD 450 HYBRID PRO (MISCELLANEOUS) ×1 IMPLANT
PAD OB MATERNITY 4.3X12.25 (PERSONAL CARE ITEMS) ×2 IMPLANT
PAD PREP 24X48 CUFFED NSTRL (MISCELLANEOUS) ×2 IMPLANT
PROTECTOR NERVE ULNAR (MISCELLANEOUS) IMPLANT
RTRCTR WOUND ALEXIS 18CM MED (MISCELLANEOUS)
RTRCTR WOUND ALEXIS 18CM SML (INSTRUMENTS)
SAVER CELL AAL HAEMONETICS (INSTRUMENTS) IMPLANT
SCISSORS LAP 5X45 EPIX DISP (ENDOMECHANICALS) IMPLANT
SEAL CANN UNIV 5-8 DVNC XI (MISCELLANEOUS) ×4 IMPLANT
SEAL XI 5MM-8MM UNIVERSAL (MISCELLANEOUS) ×8
SEALER VESSEL DA VINCI XI (MISCELLANEOUS) ×2
SEALER VESSEL EXT DVNC XI (MISCELLANEOUS) ×1 IMPLANT
SET IRRIG Y TYPE TUR BLADDER L (SET/KITS/TRAYS/PACK) IMPLANT
SET TRI-LUMEN FLTR TB AIRSEAL (TUBING) ×2 IMPLANT
SPONGE T-LAP 4X18 ~~LOC~~+RFID (SPONGE) ×2 IMPLANT
SUT VIC AB 0 CT1 27 (SUTURE) ×4
SUT VIC AB 0 CT1 27XBRD ANBCTR (SUTURE) ×2 IMPLANT
SUT VICRYL 0 UR6 27IN ABS (SUTURE) IMPLANT
SUT VICRYL RAPIDE 4/0 PS 2 (SUTURE) ×6 IMPLANT
SUT VLOC 180 0 9IN  GS21 (SUTURE) ×2
SUT VLOC 180 0 9IN GS21 (SUTURE) ×1 IMPLANT
TIP RUMI ORANGE 6.7MMX12CM (TIP) IMPLANT
TIP UTERINE 5.1X6CM LAV DISP (MISCELLANEOUS) IMPLANT
TIP UTERINE 6.7X10CM GRN DISP (MISCELLANEOUS) IMPLANT
TIP UTERINE 6.7X6CM WHT DISP (MISCELLANEOUS) IMPLANT
TIP UTERINE 6.7X8CM BLUE DISP (MISCELLANEOUS) ×2 IMPLANT
TOWEL OR 17X26 10 PK STRL BLUE (TOWEL DISPOSABLE) ×2 IMPLANT
TRENDGUARD 450 HYBRID PRO PACK (MISCELLANEOUS) ×2
TROCAR PORT AIRSEAL 8X120 (TROCAR) ×2 IMPLANT
WATER STERILE IRR 1000ML POUR (IV SOLUTION) ×2 IMPLANT

## 2021-01-16 NOTE — Anesthesia Procedure Notes (Addendum)
Procedure Name: Intubation Date/Time: 01/16/2021 12:53 PM Performed by: Suan Halter, CRNA Pre-anesthesia Checklist: Patient identified, Emergency Drugs available, Suction available and Patient being monitored Patient Re-evaluated:Patient Re-evaluated prior to induction Oxygen Delivery Method: Circle system utilized Preoxygenation: Pre-oxygenation with 100% oxygen Induction Type: IV induction Ventilation: Mask ventilation without difficulty Laryngoscope Size: Mac and 3 Tube type: Oral Tube size: 7.0 mm Number of attempts: 1 Airway Equipment and Method: Stylet and Oral airway Placement Confirmation: ETT inserted through vocal cords under direct vision, positive ETCO2 and breath sounds checked- equal and bilateral Secured at: 21 cm Tube secured with: Tape Dental Injury: Teeth and Oropharynx as per pre-operative assessment

## 2021-01-16 NOTE — H&P (Signed)
Date of Initial H&P: 01/10/2021  History reviewed, patient examined, no change in status, stable for surgery.

## 2021-01-16 NOTE — Op Note (Signed)
01/16/2021  2:34 PM  PATIENT:  Elizabeth Ponce  35 y.o. female  PRE-OPERATIVE DIAGNOSIS:  Abnormal Uterine Bleeding Fibroids, Submucosal  POST-OPERATIVE DIAGNOSIS:  * No post-op diagnosis entered *  PROCEDURE:  Procedure(s): XI ROBOTIC ASSISTED LAPAROSCOPIC HYSTERECTOMY AND BILATERAL SALPINGECTOMY. (N/A)  SURGEON:  Surgeon(s) and Role:    Christophe Louis, MD - Primary    Delora Fuel, Lenna Sciara, DO assisted due to complexity of the anatomy   PHYSICIAN ASSISTANT:   ASSISTANTS: none   ANESTHESIA:   general  EBL:  25 mL   BLOOD ADMINISTERED:none  DRAINS: Urinary Catheter (Foley)   LOCAL MEDICATIONS USED:  OTHER ropivicaine  SPECIMEN:  Source of Specimen:  uterus cervix and bilateral fallopian tubes   DISPOSITION OF SPECIMEN:  PATHOLOGY  COUNTS:  YES  TOURNIQUET:  * No tourniquets in log *  DICTATION: .Note written in EPIC  PLAN OF CARE: Admit for overnight observation  PATIENT DISPOSITION:  PACU - hemodynamically stable.   Delay start of Pharmacological VTE agent (>24hrs) due to surgical blood loss or risk of bleeding: not applicable  Findings normal uterus adhssion of the right ovary to the posterior aspect of the uterus. Normal left ovary. Normal fallopian tubes bilaterally.   Procedure: The patient was taken to the operating room where she was placed under general anesthesia.Time out was performed. Marland Kitchen She was placed in dorsal lithotomy position and prepped and draped in the usual sterile fashion. A weighted speculum was placed into the vagina. A Deaver was placed anteriorly for retraction. The anterior lip of the cervix was grasped with a single-tooth tenaculum. The vaginal mucosa was injected with 2.5 cc of ropivacaine at the 2/4/ 8 and 10 o'clock positions. The uterus was sounded to 8 cm. the cervix was dilated to 6 mm . 0 vicryl suture placed at the 12 and 6:00 positions Of the cervix to facilitate placement of a Ru mi uterine manipulator. The manipulator was placed without  difficulty. Weighted speculum and Deaver were removed .  Attention was turned to the patient's abdomen where a 8 mm trocar was placed 2 cm above the umbilicus. under direct visualization . The pneumoperitoneum was achieved with PCO2 gas. The laparoscope was removed. 60 cc of ropivacaine were injected into the abdominal cavity. The laparoscope was reinserted. An 8 mm trocar was placed in the right upper quadrant 16 centimeters from the umbilicus.later connected to robotic arm #4). An 8MM incision was made in the Right upper quadrant TROCAR WAS PLACED 8 cm from the umbilicus. Later connected to robotic arm #3. An 8 mm incision was made in the left upper quadrant 16 cm from the umbilicus and connected to robot arm #1. Marland Kitchen Attention was turned to the left upper quadrant where a 8 mm midclavicular assistant trocar was placed. ( All incision sites were injected with 10cc of ropivacaine prior to port placement. )  Once all ports had been placed under direct visualization.The laparoscope was removed and the [place] robotic system was thin right-sided docked. The robotic arms were connected to the corresponding trocars as listed above. The laparoscope was then reinserted. The long tip bipolar forceps were placed into port #1. The monopolar scissor placed in the port #4. A vessel sealerwas placed in port #3. All instruments were directed into the pelvis under direct visualization.  Attention was turned to the surgeons console.. The left mesosalpinx and left utero-ovarian ligament was cauterized and transected with the vessel sealer The broad ligament was cauterized and transected with the vessel sealer .  The round ligament was cauterized and transected with the vessel sealer  The anterior leaf of broad ligament was incised along the bladder reflection to the midline.  The right  mesosalpinx and right utero-ovarian ligament was cauterized and transected with the vessel sealer. The right broad ligament was cauterized and  transected with the vessel sealer. The right round ligament was cauterized and transected with the vessel sealer The broad ligament was incised to the midline. The bladder was dissected off the lower uterine segments of the cervix via sharp and blunt dissection.   The uterine arteries were skeleton bilaterally. They were  cauterized and transected with the vessel sealer The KOH ring was identified. The anterior colpotomy was performed followed by the posterior colpotomy. Once the uterus,cervix and bilateral fallopian tubes were completely excised was removed through the vagina. The  bipolar forceps and scissors were removed and log tip forceps were placed in the port #1 and the needle driver was placed in to port #3.  The vaginal cuff was closed with running suture if 0 v-lock. The pelvis was irrigated. Marland KitchenMarland KitchenMarland KitchenExcellent hemostasis was noted. Arista was placed along the vaginal cuff.  All pelvic pedicles were examined and hemostasis was noted.  All instruments removed from the ports. All ports were removed under direct Visualization. The pneumoperitoneum was released. The skin incisions were closed with 4-0 Vicryl and then covered with Derma bond.     Sponge lap and needle counts weIre correct x. The patient was awakened from anesthesia and taken to the recovery room in stable condition.

## 2021-01-16 NOTE — Anesthesia Postprocedure Evaluation (Signed)
Anesthesia Post Note  Patient: Associate Professor  Procedure(s) Performed: XI ROBOTIC ASSISTED LAPAROSCOPIC HYSTERECTOMY AND BILATERAL SALPINGECTOMY. (Abdomen)     Patient location during evaluation: PACU Anesthesia Type: General Level of consciousness: awake and alert Pain management: pain level controlled Vital Signs Assessment: post-procedure vital signs reviewed and stable Respiratory status: spontaneous breathing, nonlabored ventilation, respiratory function stable and patient connected to nasal cannula oxygen Cardiovascular status: blood pressure returned to baseline and stable Postop Assessment: no apparent nausea or vomiting Anesthetic complications: no   No notable events documented.  Last Vitals:  Vitals:   01/16/21 1714 01/16/21 1800  BP: (!) 173/101 (!) 156/101  Pulse: 66 70  Resp: 14 15  Temp: (!) 36.4 C (!) 36.4 C  SpO2: 100% 100%    Last Pain:  Vitals:   01/16/21 1645  TempSrc:   PainSc: 8                  Kyrstyn Greear L Willim Turnage

## 2021-01-16 NOTE — Anesthesia Preprocedure Evaluation (Addendum)
Anesthesia Evaluation  Patient identified by MRN, date of birth, ID band Patient awake    Reviewed: Allergy & Precautions, NPO status , Patient's Chart, lab work & pertinent test results, reviewed documented beta blocker date and time   History of Anesthesia Complications (+) PONV  Airway Mallampati: I  TM Distance: >3 FB Neck ROM: Full    Dental no notable dental hx. (+) Teeth Intact, Dental Advisory Given   Pulmonary neg pulmonary ROS,    Pulmonary exam normal breath sounds clear to auscultation       Cardiovascular hypertension, Pt. on home beta blockers and Pt. on medications Normal cardiovascular exam Rhythm:Regular Rate:Normal     Neuro/Psych  Headaches, PSYCHIATRIC DISORDERS Anxiety    GI/Hepatic Neg liver ROS, GERD  Controlled,  Endo/Other  negative endocrine ROS  Renal/GU negative Renal ROS  negative genitourinary   Musculoskeletal negative musculoskeletal ROS (+)   Abdominal   Peds  Hematology  (+) Blood dyscrasia (Hgb 12.6), ,   Anesthesia Other Findings   Reproductive/Obstetrics                            Anesthesia Physical Anesthesia Plan  ASA: 2  Anesthesia Plan: General   Post-op Pain Management:    Induction: Intravenous  PONV Risk Score and Plan: 4 or greater and Midazolam, Dexamethasone, Ondansetron and Scopolamine patch - Pre-op  Airway Management Planned: Oral ETT  Additional Equipment:   Intra-op Plan:   Post-operative Plan: Extubation in OR  Informed Consent: I have reviewed the patients History and Physical, chart, labs and discussed the procedure including the risks, benefits and alternatives for the proposed anesthesia with the patient or authorized representative who has indicated his/her understanding and acceptance.     Dental advisory given  Plan Discussed with: CRNA  Anesthesia Plan Comments:         Anesthesia Quick Evaluation

## 2021-01-16 NOTE — Transfer of Care (Signed)
Immediate Anesthesia Transfer of Care Note  Patient: Associate Professor  Procedure(s) Performed: Procedure(s) (LRB): XI ROBOTIC ASSISTED LAPAROSCOPIC HYSTERECTOMY AND BILATERAL SALPINGECTOMY. (N/A)  Patient Location: PACU  Anesthesia Type: General  Level of Consciousness: awake, oriented, and sleepy  Airway & Oxygen Therapy: Patient Spontanous Breathing and Patient connected to face mask oxygen  Post-op Assessment: Report given to PACU RN and Post -op Vital signs reviewed and stable  Post vital signs: Reviewed and stable  Complications: No apparent anesthesia complications Last Vitals:  Vitals Value Taken Time  BP 154/104 01/16/21 1445  Temp    Pulse    Resp 12 01/16/21 1447  SpO2    Vitals shown include unvalidated device data.  Last Pain:  Vitals:   01/16/21 0951  TempSrc: Oral         Complications: No notable events documented.

## 2021-01-17 ENCOUNTER — Encounter (HOSPITAL_BASED_OUTPATIENT_CLINIC_OR_DEPARTMENT_OTHER): Payer: Self-pay | Admitting: Obstetrics and Gynecology

## 2021-01-17 DIAGNOSIS — Z79899 Other long term (current) drug therapy: Secondary | ICD-10-CM | POA: Diagnosis not present

## 2021-01-17 DIAGNOSIS — D250 Submucous leiomyoma of uterus: Secondary | ICD-10-CM | POA: Diagnosis not present

## 2021-01-17 DIAGNOSIS — N72 Inflammatory disease of cervix uteri: Secondary | ICD-10-CM | POA: Diagnosis not present

## 2021-01-17 DIAGNOSIS — D251 Intramural leiomyoma of uterus: Secondary | ICD-10-CM | POA: Diagnosis not present

## 2021-01-17 DIAGNOSIS — N888 Other specified noninflammatory disorders of cervix uteri: Secondary | ICD-10-CM | POA: Diagnosis not present

## 2021-01-17 DIAGNOSIS — I10 Essential (primary) hypertension: Secondary | ICD-10-CM | POA: Diagnosis not present

## 2021-01-17 LAB — CBC
HCT: 35.7 % — ABNORMAL LOW (ref 36.0–46.0)
Hemoglobin: 12.4 g/dL (ref 12.0–15.0)
MCH: 33.4 pg (ref 26.0–34.0)
MCHC: 34.7 g/dL (ref 30.0–36.0)
MCV: 96.2 fL (ref 80.0–100.0)
Platelets: 290 10*3/uL (ref 150–400)
RBC: 3.71 MIL/uL — ABNORMAL LOW (ref 3.87–5.11)
RDW: 13.2 % (ref 11.5–15.5)
WBC: 6.4 10*3/uL (ref 4.0–10.5)
nRBC: 0 % (ref 0.0–0.2)

## 2021-01-17 MED ORDER — IBUPROFEN 200 MG PO TABS
ORAL_TABLET | ORAL | Status: AC
  Filled 2021-01-17: qty 3

## 2021-01-17 MED ORDER — OXYCODONE HCL 5 MG PO TABS: 5.0000 mg | ORAL_TABLET | ORAL | 0 refills | Status: AC | PRN

## 2021-01-17 MED ORDER — ACETAMINOPHEN 500 MG PO TABS
ORAL_TABLET | ORAL | Status: AC
  Filled 2021-01-17: qty 2

## 2021-01-17 MED ORDER — OXYCODONE HCL 5 MG PO TABS
ORAL_TABLET | ORAL | Status: AC
  Filled 2021-01-17: qty 2

## 2021-01-17 MED ORDER — IBUPROFEN 600 MG PO TABS: 600.0000 mg | ORAL_TABLET | Freq: Four times a day (QID) | ORAL | 1 refills | Status: AC | PRN

## 2021-01-17 MED ORDER — ACETAMINOPHEN 500 MG PO TABS: 1000.0000 mg | ORAL_TABLET | Freq: Three times a day (TID) | ORAL | 0 refills | Status: AC | PRN

## 2021-01-17 NOTE — Discharge Summary (Signed)
Physician Discharge Summary  Patient ID: Elizabeth Ponce MRN: [id number] DOB/AGE: 1986-03-27 35 y.o.  Admit date: 01/16/2021 Discharge date: 01/17/2021  Admission Diagnoses:  Discharge Diagnoses:  Active Problems:   Fibroids, intramural   S/P laparoscopic hysterectomy   Discharged Condition: stable  Hospital Course: pt was admitted for observation after undergoing a robotic assisted laparoscopic hysterectomy with bilateral salpingectomy . She did well postoperatively with return of bowel and bladder function.   Consults: None  Significant Diagnostic Studies: labs:  Results for orders placed or performed during the hospital encounter of 01/16/21 (from the past 24 hour(s))  Pregnancy, urine POC     Status: None   Collection Time: 01/16/21  9:32 AM  Result Value Ref Range   Preg Test, Ur NEGATIVE NEGATIVE  CBC     Status: Abnormal   Collection Time: 01/17/21  2:15 AM  Result Value Ref Range   WBC 6.4 4.0 - 10.5 K/uL   RBC 3.71 (L) 3.87 - 5.11 MIL/uL   Hemoglobin 12.4 12.0 - 15.0 g/dL   HCT 35.7 (L) 36.0 - 46.0 %   MCV 96.2 80.0 - 100.0 fL   MCH 33.4 26.0 - 34.0 pg   MCHC 34.7 30.0 - 36.0 g/dL   RDW 13.2 11.5 - 15.5 %   Platelets 290 150 - 400 K/uL   nRBC 0.0 0.0 - 0.2 %     Treatments: surgery: robotic assisted laparoscopic hysterectomy with bilateral salpingectomy   Discharge Exam: Blood pressure (!) 146/93, pulse 65, temperature 97.7 F (36.5 C), resp. rate 16, height '5\' 3"'$  (1.6 m), weight 58.4 kg, last menstrual period 12/14/2020, SpO2 100 %. General appearance: alert, cooperative, and no distress Resp: no distress good effort GI: soft appropriately tender nondistended  Extremities: extremities normal, atraumatic, no cyanosis or edema Incision/Wound: well approximated no erythema or exudate   Disposition: Discharge disposition: 01-Home or Self Care       Discharge Instructions     Call MD for:  persistant nausea and vomiting   Complete by: As directed     Call MD for:  redness, tenderness, or signs of infection (pain, swelling, redness, odor or green/yellow discharge around incision site)   Complete by: As directed    Call MD for:  severe uncontrolled pain   Complete by: As directed    Call MD for:  temperature >100.4   Complete by: As directed    Diet - low sodium heart healthy   Complete by: As directed    Driving Restrictions   Complete by: As directed    Avoid driving for 1 week   Increase activity slowly   Complete by: As directed    Lifting restrictions   Complete by: As directed    Avoid lifting over 10 lbs   May shower / Bathe   Complete by: As directed    May walk up steps   Complete by: As directed    No wound care   Complete by: As directed    Sexual Activity Restrictions   Complete by: As directed    Avoid sex until approved by Dr. Landry Mellow      Allergies as of 01/17/2021       Reactions   Elidel [pimecrolimus] Hives   Metronidazole    REACTION: itching        Medication List     STOP taking these medications    Norethindrone Acetate-Ethinyl Estradiol 1.5-30 MG-MCG tablet Commonly known as: LOESTRIN       TAKE these medications  acetaminophen 500 MG tablet Commonly known as: TYLENOL Take 2 tablets (1,000 mg total) by mouth every 8 (eight) hours as needed. What changed:  medication strength how much to take when to take this   acyclovir 400 MG tablet Commonly known as: ZOVIRAX Take 400 mg by mouth at bedtime.   cetirizine 10 MG tablet Commonly known as: ZYRTEC Take 10 mg by mouth at bedtime.   halobetasol 0.05 % cream Commonly known as: ULTRAVATE Apply topically as needed. For eczema   ibuprofen 600 MG tablet Commonly known as: ADVIL Take 1 tablet (600 mg total) by mouth every 6 (six) hours as needed.   losartan 25 MG tablet Commonly known as: COZAAR Take 25 mg by mouth daily.   metoprolol tartrate 25 MG tablet Commonly known as: LOPRESSOR Take 25 mg by mouth 2 (two) times  daily.   MULTIVITAMIN PO Take by mouth.   oxyCODONE 5 MG immediate release tablet Commonly known as: Oxy IR/ROXICODONE Take 1-2 tablets (5-10 mg total) by mouth every 4 (four) hours as needed for moderate pain or severe pain.        Follow-up Information     Christophe Louis, MD. Go in 2 week(s).   Specialty: Obstetrics and Gynecology Contact information: [id number] [place] [address] [phone]                 Signed: Christophe Louis 01/17/2021, 8:16 AM

## 2021-01-24 LAB — SURGICAL PATHOLOGY

## 2021-02-12 DIAGNOSIS — K219 Gastro-esophageal reflux disease without esophagitis: Secondary | ICD-10-CM | POA: Diagnosis not present

## 2021-02-12 DIAGNOSIS — R238 Other skin changes: Secondary | ICD-10-CM | POA: Diagnosis not present

## 2021-02-12 DIAGNOSIS — I10 Essential (primary) hypertension: Secondary | ICD-10-CM | POA: Diagnosis not present

## 2021-02-24 DIAGNOSIS — Z20822 Contact with and (suspected) exposure to covid-19: Secondary | ICD-10-CM | POA: Diagnosis not present

## 2021-03-05 DIAGNOSIS — M9901 Segmental and somatic dysfunction of cervical region: Secondary | ICD-10-CM | POA: Diagnosis not present

## 2021-03-05 DIAGNOSIS — M9905 Segmental and somatic dysfunction of pelvic region: Secondary | ICD-10-CM | POA: Diagnosis not present

## 2021-03-05 DIAGNOSIS — M9902 Segmental and somatic dysfunction of thoracic region: Secondary | ICD-10-CM | POA: Diagnosis not present

## 2021-03-05 DIAGNOSIS — M9903 Segmental and somatic dysfunction of lumbar region: Secondary | ICD-10-CM | POA: Diagnosis not present

## 2021-03-18 DIAGNOSIS — M9905 Segmental and somatic dysfunction of pelvic region: Secondary | ICD-10-CM | POA: Diagnosis not present

## 2021-03-18 DIAGNOSIS — M9901 Segmental and somatic dysfunction of cervical region: Secondary | ICD-10-CM | POA: Diagnosis not present

## 2021-03-18 DIAGNOSIS — M9903 Segmental and somatic dysfunction of lumbar region: Secondary | ICD-10-CM | POA: Diagnosis not present

## 2021-03-18 DIAGNOSIS — M9902 Segmental and somatic dysfunction of thoracic region: Secondary | ICD-10-CM | POA: Diagnosis not present

## 2021-04-08 DIAGNOSIS — M9901 Segmental and somatic dysfunction of cervical region: Secondary | ICD-10-CM | POA: Diagnosis not present

## 2021-04-08 DIAGNOSIS — M9902 Segmental and somatic dysfunction of thoracic region: Secondary | ICD-10-CM | POA: Diagnosis not present

## 2021-04-08 DIAGNOSIS — M9903 Segmental and somatic dysfunction of lumbar region: Secondary | ICD-10-CM | POA: Diagnosis not present

## 2021-04-08 DIAGNOSIS — M9905 Segmental and somatic dysfunction of pelvic region: Secondary | ICD-10-CM | POA: Diagnosis not present

## 2021-05-30 IMAGING — CR DG ABDOMEN 2V
2 series · 2 of 2 positions shown · non-contrast
Comparison: CT 08/19/2016

CLINICAL DATA: Generalized abdominal pain

EXAM:
ABDOMEN - 2 VIEW

[w abdomen upright]
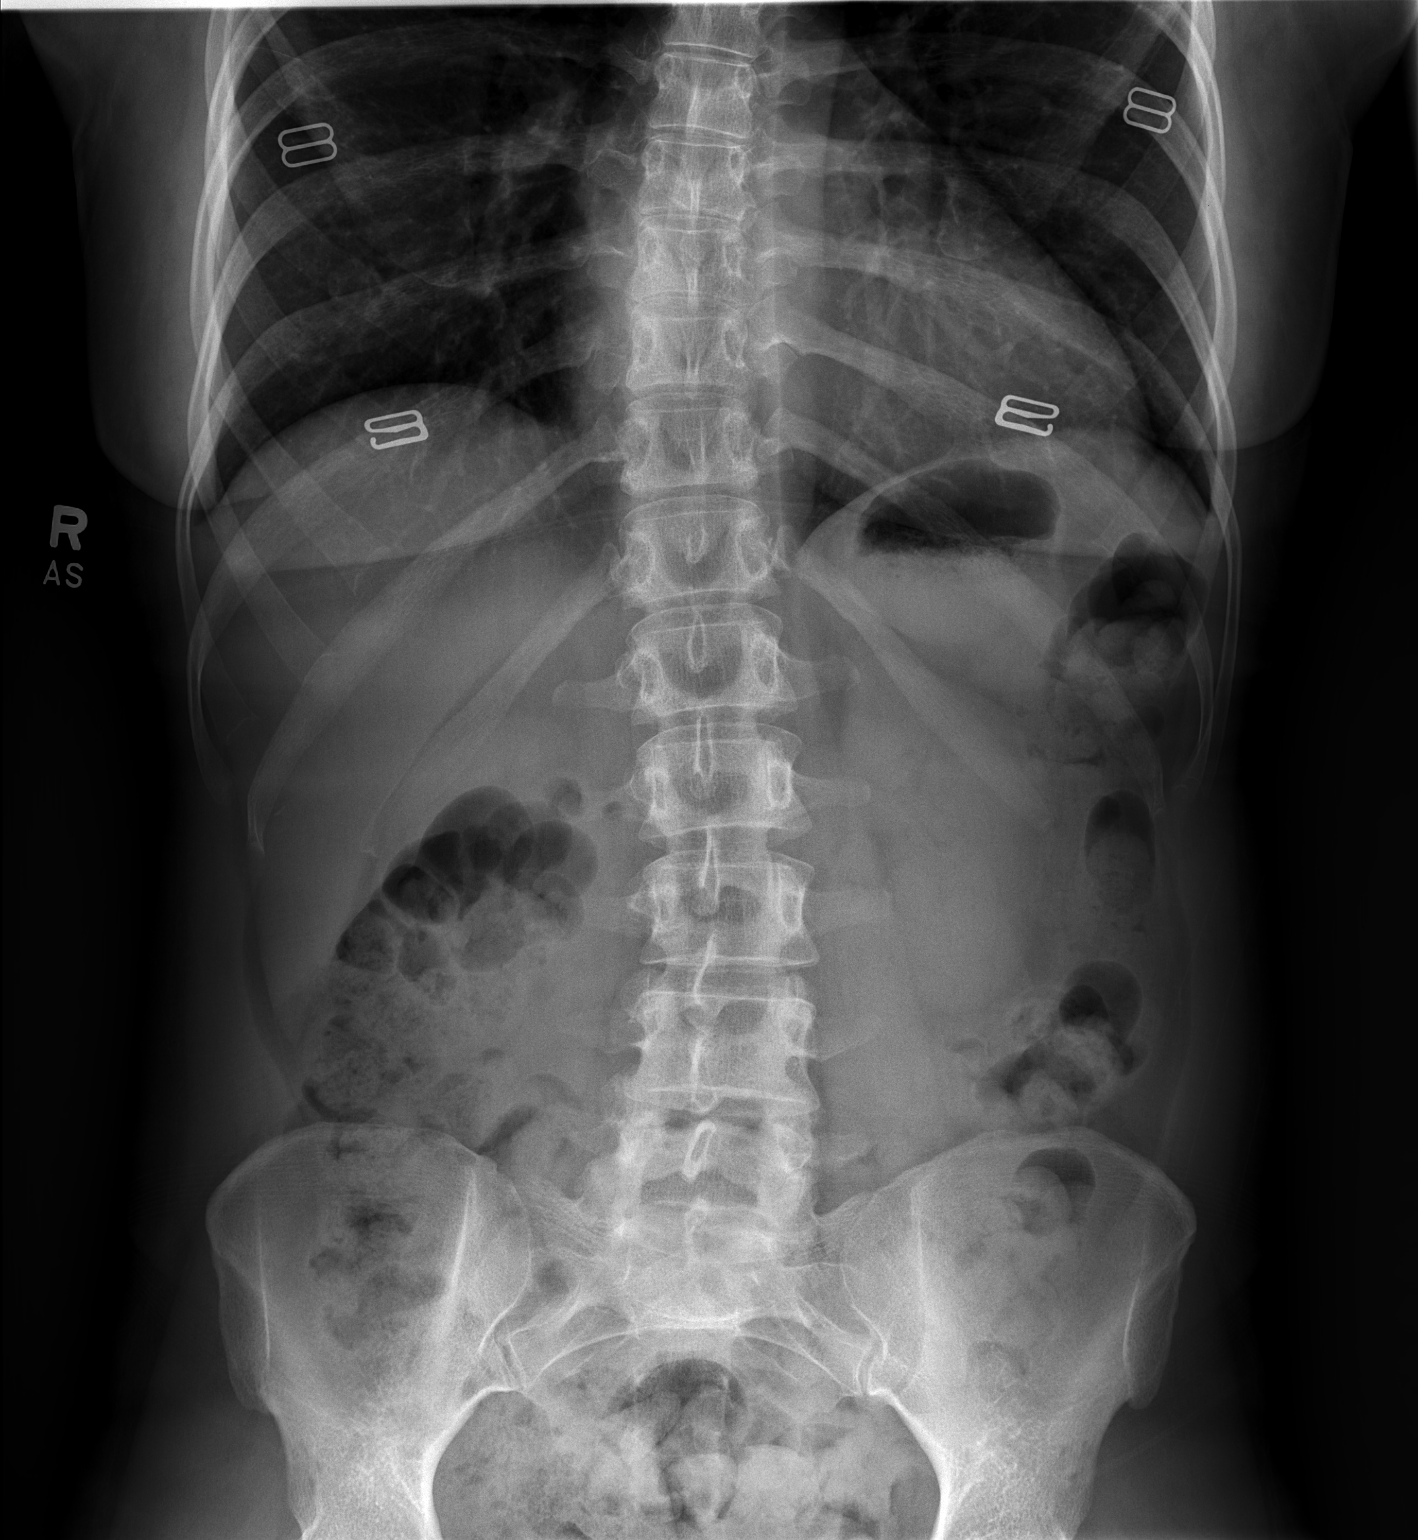

[t abdomen supine]
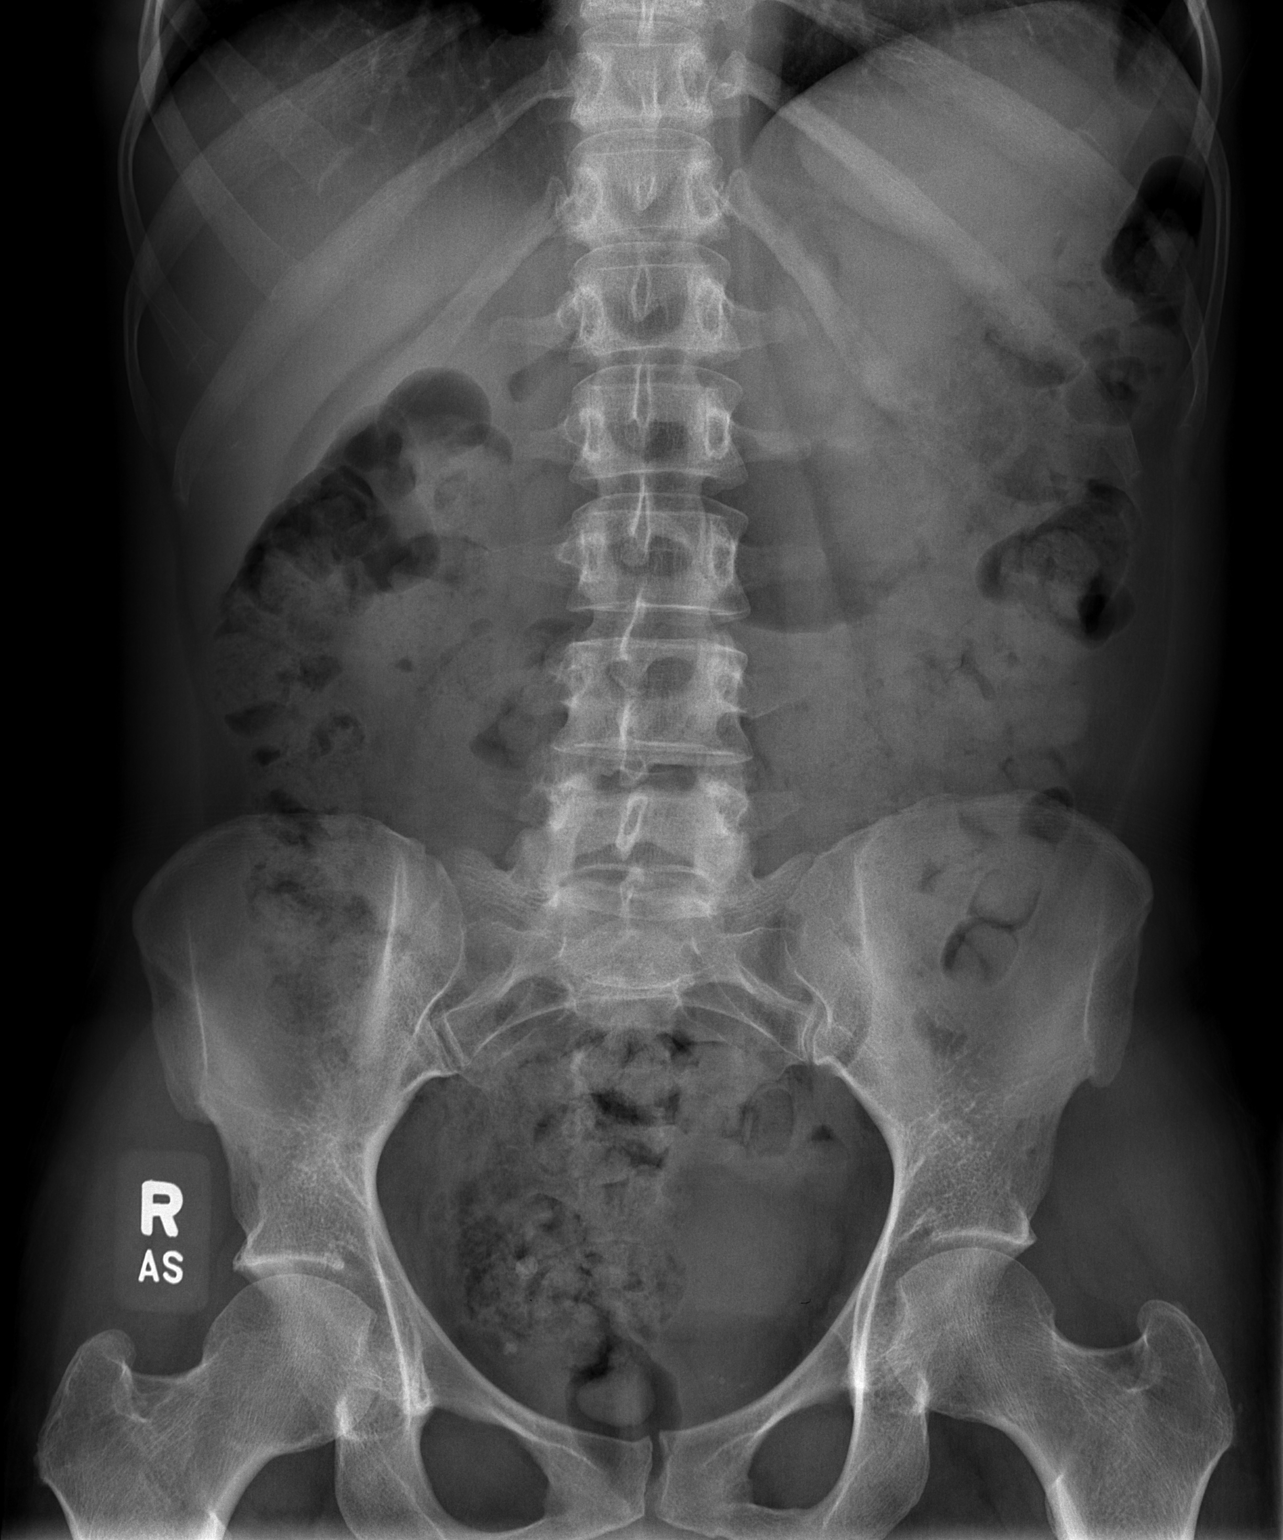

[2 of 2 positions shown; findings below may reference images not displayed]

FINDINGS: Lung bases are clear. No free air beneath the diaphragm.
Nonobstructed gas pattern with large amount of stool in the colon.
No radiopaque calculi.
IMPRESSION: Nonobstructed gas pattern with large amount of stool in the colon.

## 2021-08-30 IMAGING — CT CT ANGIO CHEST
2 of 7 series · 19 of 46 positions shown · IV contrast (omnipaque)
Comparison: None.

CLINICAL DATA: Headache and lightheaded for 2 days

EXAM:
CT ANGIOGRAPHY CHEST WITH CONTRAST
TECHNIQUE: Multidetector CT imaging of the chest was performed using the
standard protocol during bolus administration of intravenous
contrast. Multiplanar CT image reconstructions and MIPs were
obtained to evaluate the vascular anatomy.
CONTRAST:  75mL OMNIPAQUE IOHEXOL 350 MG/ML SOLN

[Series 7: thins · axial · 0.65mm/px · z∈[+1145,+1410]mm · 16 of 426 slices shown]
[im 24/426  lung]
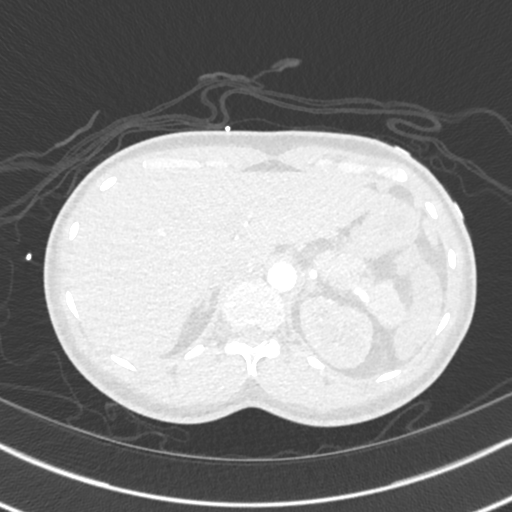
[im 48/426  soft-tissue]
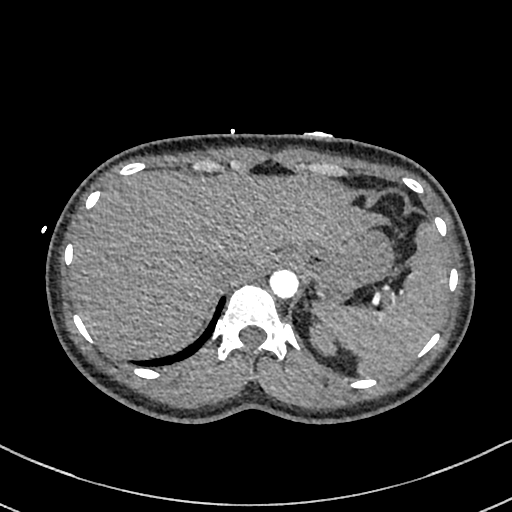
[im 71/426  lung]
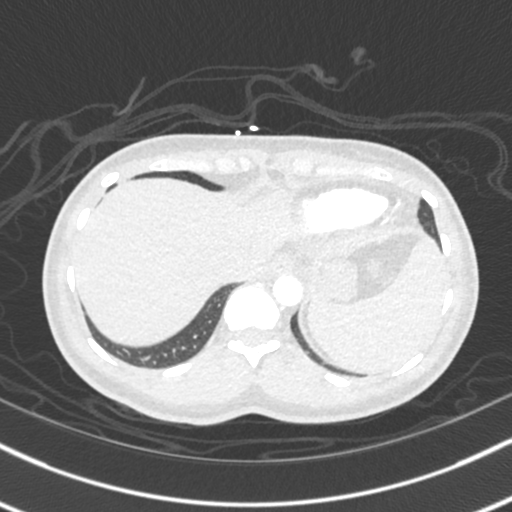
[im 95/426  soft-tissue]
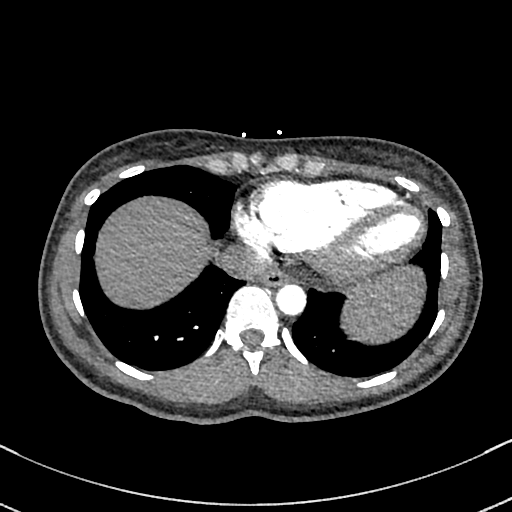
[im 119/426  lung]
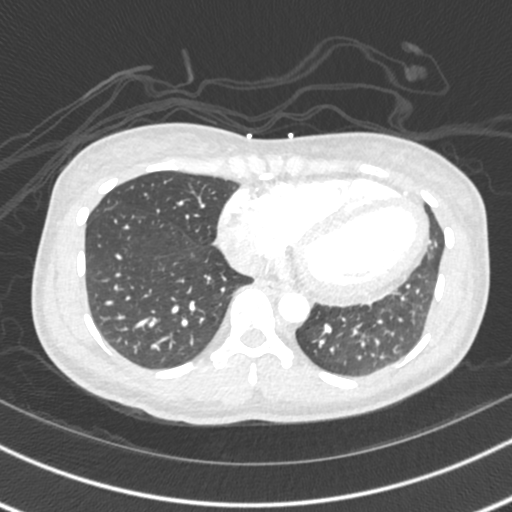
[im 142/426  soft-tissue]
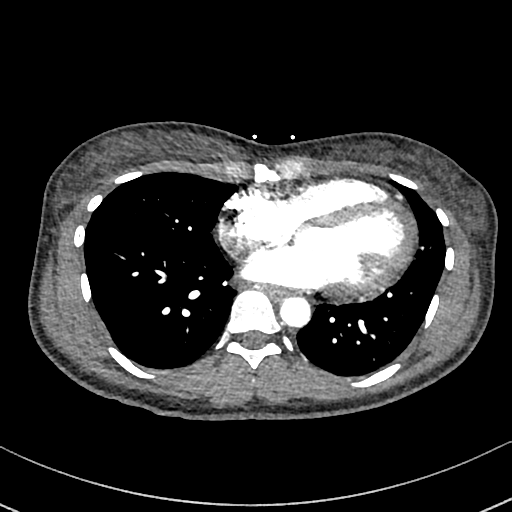
[im 166/426  lung]
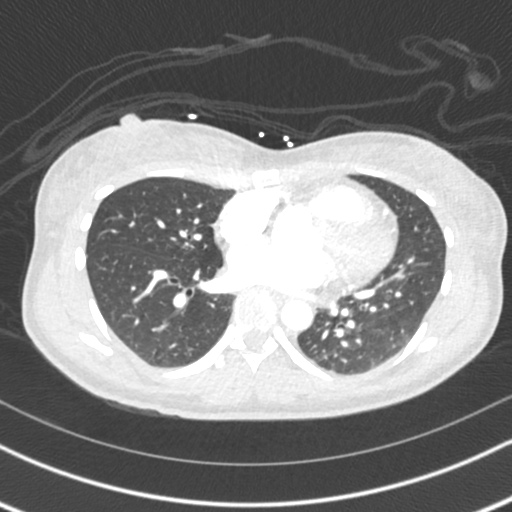
[im 189/426  soft-tissue]
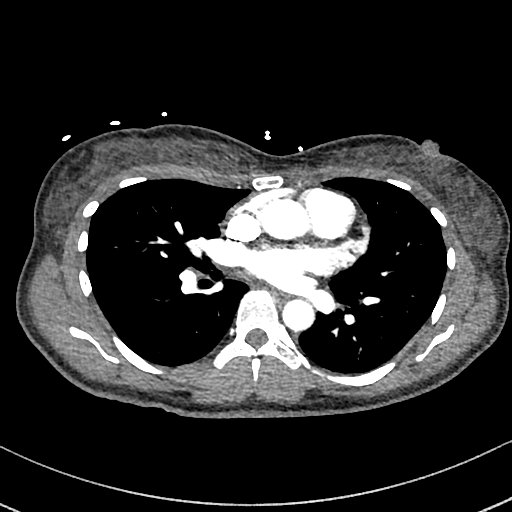
[im 237/426  lung]
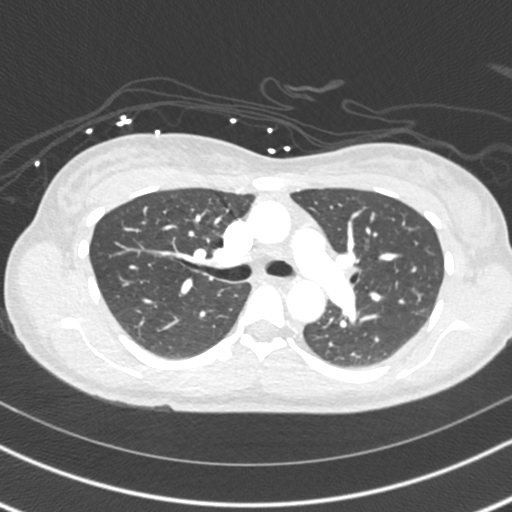
[im 260/426  soft-tissue]
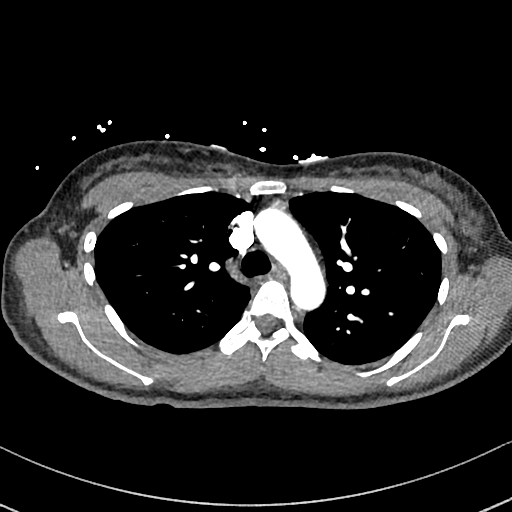
[im 284/426  lung]
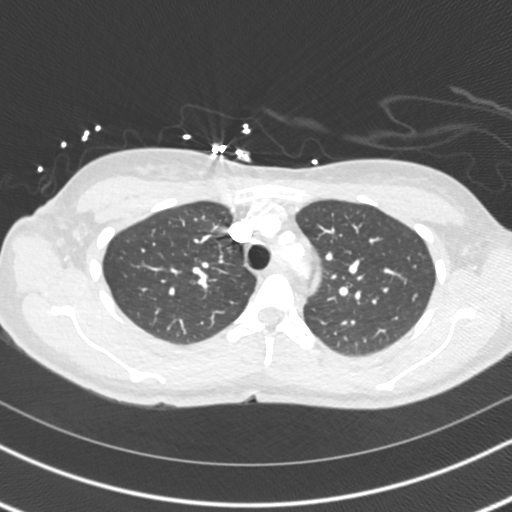
[im 307/426  soft-tissue]
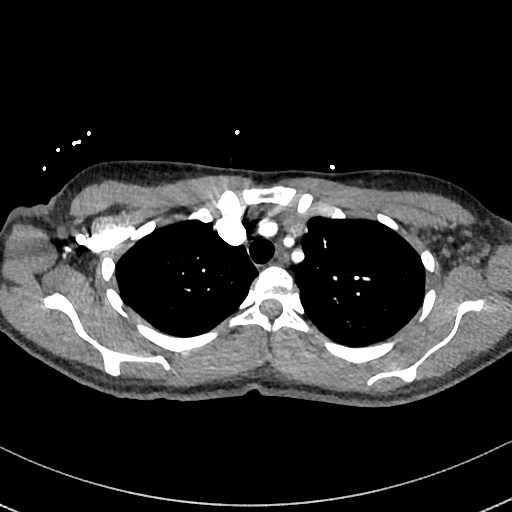
[im 331/426  lung]
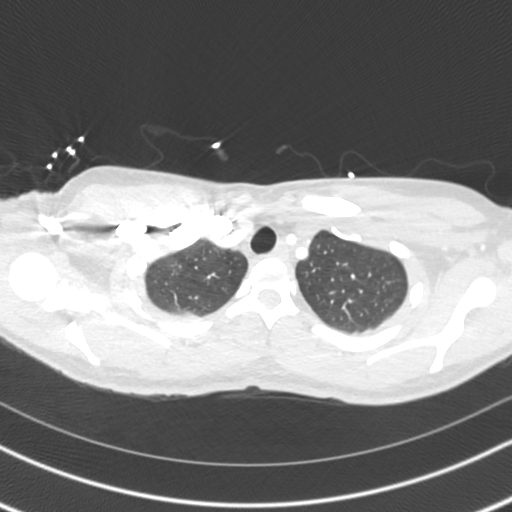
[im 355/426  soft-tissue]
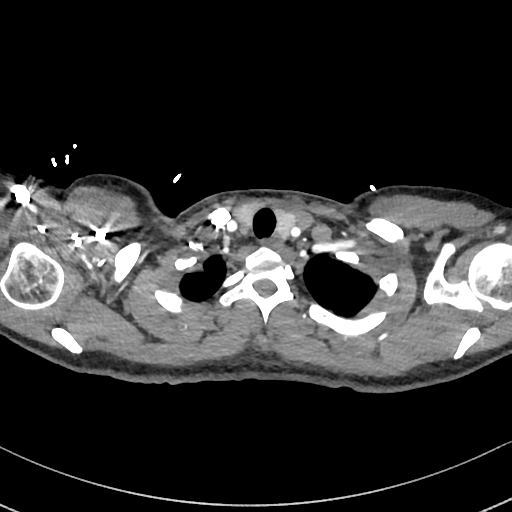
[im 378/426  lung]
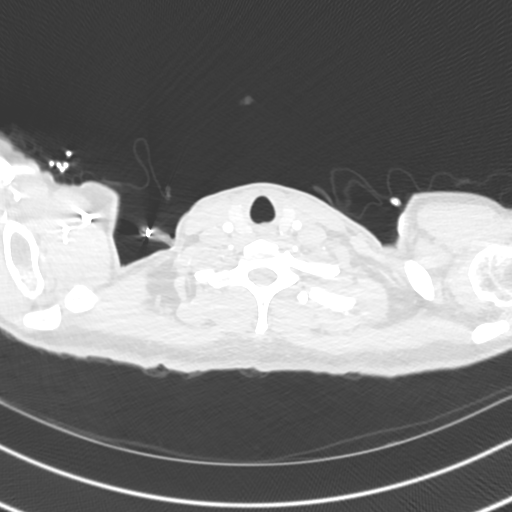
[im 402/426  soft-tissue]
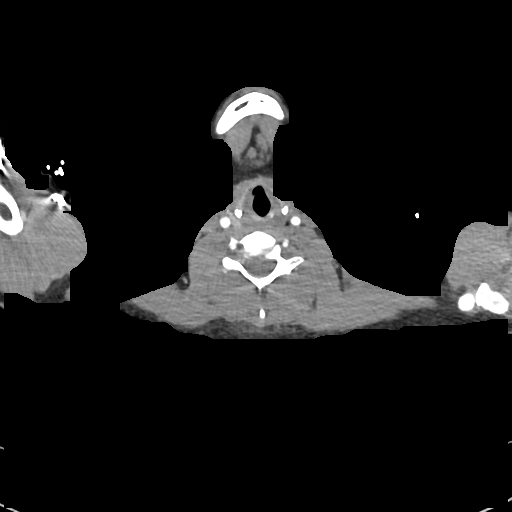

[Series 9: cor · coronal · 0.60mm/px · 3 of 120 slices shown]
[im 30/120  soft-tissue]
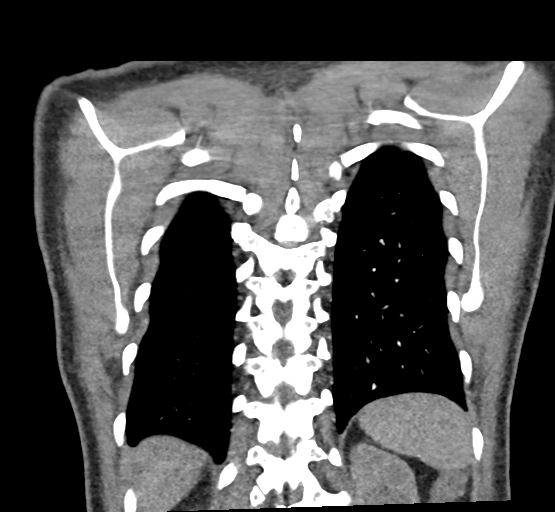
[im 60/120  soft-tissue]
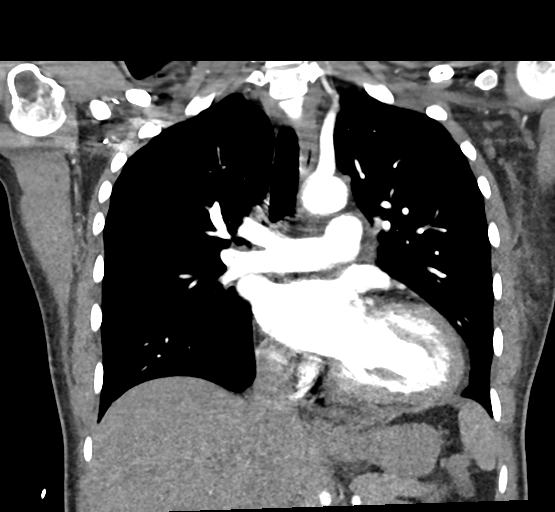
[im 90/120  soft-tissue]
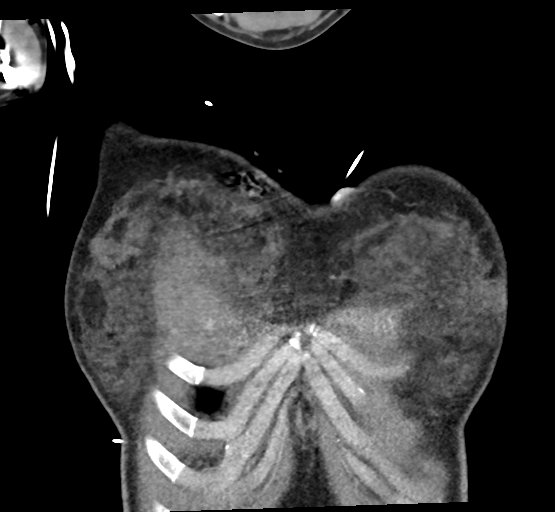

[19 of 46 positions shown; findings below may reference images not displayed]

FINDINGS: Cardiovascular: Satisfactory opacification of the pulmonary arteries
to the segmental level. No evidence of pulmonary embolism. Normal
heart size. No pericardial effusion.

Mediastinum/Nodes: No enlarged mediastinal, hilar, or axillary lymph
nodes. Thyroid gland, trachea, and esophagus demonstrate no
significant findings.

Lungs/Pleura: Lungs are clear. No pleural effusion or pneumothorax.

Upper Abdomen: No acute abnormality.

Musculoskeletal: No chest wall abnormality. No acute or significant
osseous findings.

Review of the MIP images confirms the above findings.
IMPRESSION: Negative examination for pulmonary embolism.

## 2021-10-29 DIAGNOSIS — R69 Illness, unspecified: Secondary | ICD-10-CM | POA: Diagnosis not present

## 2021-10-29 DIAGNOSIS — N842 Polyp of vagina: Secondary | ICD-10-CM | POA: Diagnosis not present

## 2021-10-31 DIAGNOSIS — K219 Gastro-esophageal reflux disease without esophagitis: Secondary | ICD-10-CM | POA: Diagnosis not present

## 2021-10-31 DIAGNOSIS — Z90710 Acquired absence of both cervix and uterus: Secondary | ICD-10-CM | POA: Diagnosis not present

## 2021-10-31 DIAGNOSIS — I10 Essential (primary) hypertension: Secondary | ICD-10-CM | POA: Diagnosis not present

## 2021-10-31 DIAGNOSIS — D500 Iron deficiency anemia secondary to blood loss (chronic): Secondary | ICD-10-CM | POA: Diagnosis not present

## 2021-10-31 DIAGNOSIS — Z0000 Encounter for general adult medical examination without abnormal findings: Secondary | ICD-10-CM | POA: Diagnosis not present

## 2021-11-22 DIAGNOSIS — N939 Abnormal uterine and vaginal bleeding, unspecified: Secondary | ICD-10-CM | POA: Diagnosis not present

## 2021-11-22 DIAGNOSIS — N898 Other specified noninflammatory disorders of vagina: Secondary | ICD-10-CM | POA: Diagnosis not present

## 2021-11-27 DIAGNOSIS — M9901 Segmental and somatic dysfunction of cervical region: Secondary | ICD-10-CM | POA: Diagnosis not present

## 2021-11-27 DIAGNOSIS — M9905 Segmental and somatic dysfunction of pelvic region: Secondary | ICD-10-CM | POA: Diagnosis not present

## 2021-11-27 DIAGNOSIS — M9903 Segmental and somatic dysfunction of lumbar region: Secondary | ICD-10-CM | POA: Diagnosis not present

## 2021-11-27 DIAGNOSIS — M9902 Segmental and somatic dysfunction of thoracic region: Secondary | ICD-10-CM | POA: Diagnosis not present
